# Patient Record
Sex: Female | Born: 1955
Health system: Southern US, Community
[De-identification: ages and names within clinical notes are randomized; demographics above are authoritative.]

## PROBLEM LIST (undated history)

## (undated) DIAGNOSIS — J189 Pneumonia, unspecified organism: Secondary | ICD-10-CM

## (undated) DIAGNOSIS — Z973 Presence of spectacles and contact lenses: Secondary | ICD-10-CM

## (undated) DIAGNOSIS — Z8489 Family history of other specified conditions: Secondary | ICD-10-CM

## (undated) DIAGNOSIS — K449 Diaphragmatic hernia without obstruction or gangrene: Secondary | ICD-10-CM

## (undated) DIAGNOSIS — M810 Age-related osteoporosis without current pathological fracture: Secondary | ICD-10-CM

## (undated) DIAGNOSIS — F32A Depression, unspecified: Secondary | ICD-10-CM

## (undated) DIAGNOSIS — F419 Anxiety disorder, unspecified: Secondary | ICD-10-CM

## (undated) DIAGNOSIS — F329 Major depressive disorder, single episode, unspecified: Secondary | ICD-10-CM

## (undated) DIAGNOSIS — Z8719 Personal history of other diseases of the digestive system: Secondary | ICD-10-CM

## (undated) DIAGNOSIS — I959 Hypotension, unspecified: Secondary | ICD-10-CM

## (undated) DIAGNOSIS — R011 Cardiac murmur, unspecified: Secondary | ICD-10-CM

## (undated) DIAGNOSIS — K219 Gastro-esophageal reflux disease without esophagitis: Secondary | ICD-10-CM

## (undated) DIAGNOSIS — Z8701 Personal history of pneumonia (recurrent): Secondary | ICD-10-CM

## (undated) DIAGNOSIS — M1990 Unspecified osteoarthritis, unspecified site: Secondary | ICD-10-CM

## (undated) DIAGNOSIS — G20A1 Parkinson's disease without dyskinesia, without mention of fluctuations: Secondary | ICD-10-CM

## (undated) DIAGNOSIS — T7840XA Allergy, unspecified, initial encounter: Secondary | ICD-10-CM

## (undated) DIAGNOSIS — K279 Peptic ulcer, site unspecified, unspecified as acute or chronic, without hemorrhage or perforation: Secondary | ICD-10-CM

## (undated) DIAGNOSIS — M19049 Primary osteoarthritis, unspecified hand: Secondary | ICD-10-CM

## (undated) DIAGNOSIS — E039 Hypothyroidism, unspecified: Secondary | ICD-10-CM

## (undated) DIAGNOSIS — G250 Essential tremor: Secondary | ICD-10-CM

## (undated) DIAGNOSIS — G709 Myoneural disorder, unspecified: Secondary | ICD-10-CM

## (undated) HISTORY — PX: TONGUE SURGERY: SHX810

## (undated) HISTORY — PX: WISDOM TOOTH EXTRACTION: SHX21

## (undated) HISTORY — DX: Myoneural disorder, unspecified: G70.9

## (undated) HISTORY — DX: Peptic ulcer, site unspecified, unspecified as acute or chronic, without hemorrhage or perforation: K27.9

## (undated) HISTORY — PX: TUBAL LIGATION: SHX77

## (undated) HISTORY — DX: Cardiac murmur, unspecified: R01.1

## (undated) HISTORY — DX: Depression, unspecified: F32.A

## (undated) HISTORY — PX: DILATION AND CURETTAGE OF UTERUS: SHX78

## (undated) HISTORY — DX: Personal history of pneumonia (recurrent): Z87.01

## (undated) HISTORY — DX: Allergy, unspecified, initial encounter: T78.40XA

## (undated) HISTORY — DX: Anxiety disorder, unspecified: F41.9

---
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
## 1988-07-17 HISTORY — PX: HERNIA REPAIR: SHX51

## 1998-01-06 ENCOUNTER — Other Ambulatory Visit: Admission: RE | Admit: 1998-01-06 | Discharge: 1998-01-06 | Payer: Self-pay | Admitting: Obstetrics and Gynecology

## 1999-01-03 ENCOUNTER — Other Ambulatory Visit: Admission: RE | Admit: 1999-01-03 | Discharge: 1999-01-03 | Payer: Self-pay | Admitting: Obstetrics and Gynecology

## 2000-01-02 ENCOUNTER — Other Ambulatory Visit: Admission: RE | Admit: 2000-01-02 | Discharge: 2000-01-02 | Payer: Self-pay | Admitting: Obstetrics and Gynecology

## 2001-01-14 ENCOUNTER — Other Ambulatory Visit: Admission: RE | Admit: 2001-01-14 | Discharge: 2001-01-14 | Payer: Self-pay | Admitting: Obstetrics and Gynecology

## 2010-01-20 ENCOUNTER — Ambulatory Visit: Payer: Self-pay | Admitting: Oncology

## 2010-02-02 LAB — MORPHOLOGY

## 2010-02-02 LAB — CBC & DIFF AND RETIC
Eosinophils Absolute: 0.2 10*3/uL (ref 0.0–0.5)
HCT: 42 % (ref 34.8–46.6)
Immature Retic Fract: 2.8 % (ref 0.00–10.70)
LYMPH%: 42.8 % (ref 14.0–49.7)
MCHC: 33.6 g/dL (ref 31.5–36.0)
MCV: 101.9 fL — ABNORMAL HIGH (ref 79.5–101.0)
MONO#: 0.9 10*3/uL (ref 0.1–0.9)
MONO%: 5.2 % (ref 0.0–14.0)
NEUT#: 8.6 10*3/uL — ABNORMAL HIGH (ref 1.5–6.5)
NEUT%: 50.5 % (ref 38.4–76.8)
Platelets: 232 10*3/uL (ref 145–400)
WBC: 17.1 10*3/uL — ABNORMAL HIGH (ref 3.9–10.3)

## 2010-02-07 LAB — FOLATE: Folate: 18.8 ng/mL

## 2010-02-07 LAB — VITAMIN B12: Vitamin B-12: 522 pg/mL (ref 211–911)

## 2010-02-07 LAB — ANA: Anti Nuclear Antibody(ANA): POSITIVE — AB

## 2010-02-07 LAB — LACTATE DEHYDROGENASE: LDH: 106 U/L (ref 94–250)

## 2010-09-22 ENCOUNTER — Other Ambulatory Visit: Payer: Self-pay | Admitting: Geriatric Medicine

## 2010-09-22 ENCOUNTER — Ambulatory Visit
Admission: RE | Admit: 2010-09-22 | Discharge: 2010-09-22 | Disposition: A | Discharge status: Home / Self Care | Payer: BC Managed Care – PPO | Source: Ambulatory Visit | Attending: Geriatric Medicine | Admitting: Geriatric Medicine

## 2010-09-22 DIAGNOSIS — R52 Pain, unspecified: Secondary | ICD-10-CM

## 2012-01-16 ENCOUNTER — Other Ambulatory Visit: Payer: Self-pay | Admitting: *Deleted

## 2012-01-16 ENCOUNTER — Other Ambulatory Visit: Payer: Self-pay | Admitting: Geriatric Medicine

## 2012-01-16 DIAGNOSIS — Z1231 Encounter for screening mammogram for malignant neoplasm of breast: Secondary | ICD-10-CM

## 2012-02-07 ENCOUNTER — Ambulatory Visit
Admission: RE | Admit: 2012-02-07 | Discharge: 2012-02-07 | Disposition: A | Discharge status: Home / Self Care | Payer: BC Managed Care – PPO | Source: Ambulatory Visit | Attending: Geriatric Medicine | Admitting: Geriatric Medicine

## 2012-02-07 DIAGNOSIS — Z1231 Encounter for screening mammogram for malignant neoplasm of breast: Secondary | ICD-10-CM

## 2012-12-13 ENCOUNTER — Emergency Department (HOSPITAL_COMMUNITY)
Admission: EM | Admit: 2012-12-13 | Discharge: 2012-12-13 | Disposition: A | Discharge status: Home / Self Care | Payer: BC Managed Care – PPO | Attending: Emergency Medicine | Admitting: Emergency Medicine

## 2012-12-13 ENCOUNTER — Emergency Department (HOSPITAL_COMMUNITY): Payer: BC Managed Care – PPO

## 2012-12-13 ENCOUNTER — Encounter (HOSPITAL_COMMUNITY): Payer: Self-pay | Admitting: Emergency Medicine

## 2012-12-13 DIAGNOSIS — S0990XA Unspecified injury of head, initial encounter: Secondary | ICD-10-CM | POA: Insufficient documentation

## 2012-12-13 DIAGNOSIS — Z8679 Personal history of other diseases of the circulatory system: Secondary | ICD-10-CM | POA: Insufficient documentation

## 2012-12-13 DIAGNOSIS — R209 Unspecified disturbances of skin sensation: Secondary | ICD-10-CM | POA: Insufficient documentation

## 2012-12-13 DIAGNOSIS — Z880 Allergy status to penicillin: Secondary | ICD-10-CM | POA: Insufficient documentation

## 2012-12-13 DIAGNOSIS — Y92009 Unspecified place in unspecified non-institutional (private) residence as the place of occurrence of the external cause: Secondary | ICD-10-CM | POA: Insufficient documentation

## 2012-12-13 DIAGNOSIS — W1809XA Striking against other object with subsequent fall, initial encounter: Secondary | ICD-10-CM | POA: Insufficient documentation

## 2012-12-13 DIAGNOSIS — Y9389 Activity, other specified: Secondary | ICD-10-CM | POA: Insufficient documentation

## 2012-12-13 DIAGNOSIS — S0993XA Unspecified injury of face, initial encounter: Secondary | ICD-10-CM | POA: Insufficient documentation

## 2012-12-13 DIAGNOSIS — M542 Cervicalgia: Secondary | ICD-10-CM

## 2012-12-13 HISTORY — DX: Hypotension, unspecified: I95.9

## 2012-12-13 MED ORDER — NAPROXEN 500 MG PO TABS: 500.0000 mg | ORAL_TABLET | Freq: Two times a day (BID) | ORAL | Status: DC

## 2012-12-13 NOTE — Progress Notes (Signed)
[facility] CM noted pt with coverage but no pcp listed Spoke with pt who confirms no pcp  [facility] CM spoke with pt on how to obtain an in network pcp with insurance coverage via the customer service number or web site  Provided her with a 9 page list of in network pcps within 5 mile radius of zip [phone] Pt states previously seen by [facility] 3 years ago and she was informed when she called them this am that because she had not been seen in 3 years she was no longer an active pt

## 2012-12-13 NOTE — ED Notes (Signed)
Pt states that she tripped while outsdie and hit head on the wall of the house on Monday and stats that since she has had a headache, dizzyness, and feels like "pressure" to head, stiffness to neck, and bil temple area. Had a steriod shot on Sunday. Alert x4, no facial droop, no slurred speech

## 2012-12-13 NOTE — ED Provider Notes (Signed)
History     CSN: [id number]  Arrival date & time 12/13/12  1114   First MD Initiated Contact with Patient 12/13/12 1159      Chief Complaint  Patient presents with  . Headache  . Fall    (Consider location/radiation/quality/duration/timing/severity/associated sxs/prior treatment) HPI Comments: 57 year old female who presents with complaint of a head injury which occurred on Monday. She states that she was leaning forward trying to do some yard work when she slipped forward striking her head against a wall. She since that time has had a mild headache, a feeling of haziness or fogginess which is associated with left arm and leg tingling and a feeling of nausea with no appetite. She denies facial droop, slurred speech or ataxia and overall her symptoms are persistent and not seeming to improve. Nothing makes this better or worse, symptoms are mild to moderate  Patient is a 57 y.o. female presenting with headaches and fall. The history is provided by the patient.  Headache Fall Associated symptoms include headaches.    Past Medical History  Diagnosis Date  . Hypotension     History reviewed. No pertinent past surgical history.  No family history on file.  History  Substance Use Topics  . Smoking status: Not on file  . Smokeless tobacco: Not on file  . Alcohol Use: Not on file    OB History   Grav Para Term Preterm Abortions TAB SAB Ect Mult Living                  Review of Systems  Neurological: Positive for headaches.  All other systems reviewed and are negative.    Allergies  Penicillins  Home Medications   Current Outpatient Rx  Name  Route  Sig  Dispense  Refill  . ibuprofen (ADVIL,MOTRIN) 200 MG tablet   Oral   Take 400 mg by mouth every 6 (six) hours as needed for pain (headache).         [facility] PRESCRIPTION MEDICATION      Biest Cream - estrogen/testosterone         . progesterone (PROMETRIUM) 100 MG capsule   Oral   Take 100 mg by mouth  daily.         . naproxen (NAPROSYN) 500 MG tablet   Oral   Take 1 tablet (500 mg total) by mouth 2 (two) times daily with a meal.   30 tablet   0     BP 121/66  Pulse 73  Temp(Src) 97.5 F (36.4 C) (Oral)  Resp 18  SpO2 99%  Physical Exam  Nursing note and vitals reviewed. Constitutional: She appears well-developed and well-nourished. No distress.  HENT:  Head: Normocephalic and atraumatic.  Mouth/Throat: Oropharynx is clear and moist. No oropharyngeal exudate.  No signs of trauma to the head, normocephalic, atraumatic, oropharynx is clear with moist mucous membranes and a clear pharynx without asymmetry, exudate, hypertrophy or erythema. Nasal passages are clear with no rhinorrhea or turbinate swelling. Tympanic membranes are clear bilaterally  no hemotympanum, no malocclusion, no raccoon eyes or Battle sign  Eyes: Conjunctivae and EOM are normal. Pupils are equal, round, and reactive to light. Right eye exhibits no discharge. Left eye exhibits no discharge. No scleral icterus.  Neck: Normal range of motion. Neck supple. No JVD present. No thyromegaly present.  Cardiovascular: Normal rate, regular rhythm, normal heart sounds and intact distal pulses.  Exam reveals no gallop and no friction rub.   No murmur heard. Pulmonary/Chest: Effort normal  and breath sounds normal. No respiratory distress. She has no wheezes. She has no rales.  Abdominal: Soft. Bowel sounds are normal. She exhibits no distension and no mass. There is no tenderness.  Musculoskeletal: Normal range of motion. She exhibits no edema and no tenderness.  Lymphadenopathy:    She has no cervical adenopathy.  Neurological: She is alert. Coordination normal.  The patient has normal speech, normal memory, normal coordination of all 4 extremities without any limb ataxia. The patient has normal strength of all 4 extremities at the major muscle groups including shoulders, biceps, triceps, forearm, grips, quadriceps,  hamstrings, calves. Normal sensation to light touch and pinprick of all 4 extremities and the trunk. No truncal ataxia, normal gait, cranial nerves III through XII are intact.  Normal peripheral visual fields. Normal extraocular movements. Normal pupillary exam. No pronator drift, normal reflexes at the brachial radialis, biceps and patellar tendons. Normal position sense, normal temperature sensation to cold and warm  Skin: Skin is warm and dry. No rash noted. No erythema.  Psychiatric: She has a normal mood and affect. Her behavior is normal.    ED Course  Procedures (including critical care time)  Labs Reviewed - No data to display Ct Head Wo Contrast  12/13/2012   *RADIOLOGY REPORT*  Clinical Data:  Severe headache, dizziness, stiffness, trauma  CT HEAD WITHOUT CONTRAST  Technique:  Contiguous axial images were obtained from the base of the skull through the vertex without contrast  Comparison:  None.  Findings:  The brain has a normal appearance without evidence for hemorrhage, acute infarction, hydrocephalus, or mass lesion.  There is no extra axial fluid collection.  The skull and paranasal sinuses are normal.  IMPRESSION: Normal CT of the head without contrast.  CT CERVICAL SPINE WITHOUT CONTRAST  Technique:  Multidetector CT imaging of the cervical spine was performed without intravenous contrast.  Multiplanar CT image reconstructions were also generated.  Comparison:   None.  Findings:  Normal alignment.  Negative for fracture, compression deformity, or focal kyphosis.  Facets aligned.  Mild spondylosis and degenerative change at C5-6 and C6-7.  Preserved vertebral body heights.  Intact odontoid.  Normal prevertebral soft tissues.  No soft tissue asymmetry in the neck.  Lung apices clear.  IMPRESSION: Degenerative changes.  No acute fracture.   Original Report Authenticated By: Judie Petit. Miles Costain, M.D.   Ct Cervical Spine Wo Contrast  12/13/2012   *RADIOLOGY REPORT*  Clinical Data:  Severe headache,  dizziness, stiffness, trauma  CT HEAD WITHOUT CONTRAST  Technique:  Contiguous axial images were obtained from the base of the skull through the vertex without contrast  Comparison:  None.  Findings:  The brain has a normal appearance without evidence for hemorrhage, acute infarction, hydrocephalus, or mass lesion.  There is no extra axial fluid collection.  The skull and paranasal sinuses are normal.  IMPRESSION: Normal CT of the head without contrast.  CT CERVICAL SPINE WITHOUT CONTRAST  Technique:  Multidetector CT imaging of the cervical spine was performed without intravenous contrast.  Multiplanar CT image reconstructions were also generated.  Comparison:   None.  Findings:  Normal alignment.  Negative for fracture, compression deformity, or focal kyphosis.  Facets aligned.  Mild spondylosis and degenerative change at C5-6 and C6-7.  Preserved vertebral body heights.  Intact odontoid.  Normal prevertebral soft tissues.  No soft tissue asymmetry in the neck.  Lung apices clear.  IMPRESSION: Degenerative changes.  No acute fracture.   Original Report Authenticated By: Judie Petit. Shick,  M.D.     1. Minor head injury, initial encounter   2. Neck pain       MDM  Overall the patient appears very well, she has no objective findings of numbness or weakness in her left arm but has subjective feelings and with her neck injury and head injury will need to have imaging to rule out fracture and she had fractured her cervical spine at a young age. Vital signs are normal, neurologic intact, denies infectious symptoms, declines pain medications or nausea medications at time this.  CT scan imaging negative, stable for discharge  Meds given in ED:  Medications - No data to display  New Prescriptions   NAPROXEN (NAPROSYN) 500 MG TABLET    Take 1 tablet (500 mg total) by mouth 2 (two) times daily with a meal.          Vida Roller, MD 12/13/12 1300

## 2012-12-30 ENCOUNTER — Ambulatory Visit: Payer: BC Managed Care – PPO

## 2012-12-30 ENCOUNTER — Ambulatory Visit: Payer: BC Managed Care – PPO | Admitting: Emergency Medicine

## 2012-12-30 VITALS — BP 98/66 | HR 80 | Temp 98.4°F | Resp 18 | Wt 170.0 lb

## 2012-12-30 DIAGNOSIS — R059 Cough, unspecified: Secondary | ICD-10-CM

## 2012-12-30 DIAGNOSIS — J309 Allergic rhinitis, unspecified: Secondary | ICD-10-CM

## 2012-12-30 DIAGNOSIS — K219 Gastro-esophageal reflux disease without esophagitis: Secondary | ICD-10-CM

## 2012-12-30 DIAGNOSIS — T782XXD Anaphylactic shock, unspecified, subsequent encounter: Secondary | ICD-10-CM

## 2012-12-30 DIAGNOSIS — R05 Cough: Secondary | ICD-10-CM

## 2012-12-30 DIAGNOSIS — J45909 Unspecified asthma, uncomplicated: Secondary | ICD-10-CM

## 2012-12-30 MED ORDER — MONTELUKAST SODIUM 10 MG PO TABS: 10.0000 mg | ORAL_TABLET | Freq: Every day | ORAL | Status: DC

## 2012-12-30 MED ORDER — ALBUTEROL SULFATE (2.5 MG/3ML) 0.083% IN NEBU
2.5000 mg | INHALATION_SOLUTION | Freq: Once | RESPIRATORY_TRACT | Status: AC
  Administered 2012-12-30: 2.5 mg via RESPIRATORY_TRACT

## 2012-12-30 MED ORDER — PREDNISONE 10 MG PO TABS: ORAL_TABLET | ORAL | Status: DC

## 2012-12-30 MED ORDER — ALBUTEROL SULFATE HFA 108 (90 BASE) MCG/ACT IN AERS: 2.0000 | INHALATION_SPRAY | RESPIRATORY_TRACT | Status: DC | PRN

## 2012-12-30 MED ORDER — FAMOTIDINE 20 MG PO TABS: 20.0000 mg | ORAL_TABLET | Freq: Every evening | ORAL | Status: DC | PRN

## 2012-12-30 MED ORDER — EPINEPHRINE 0.3 MG/0.3ML IJ SOAJ: 0.3000 mg | Freq: Once | INTRAMUSCULAR | Status: DC

## 2012-12-30 NOTE — Progress Notes (Signed)
  Subjective:    Patient ID: Diane Bryant, female    DOB: 1955/09/11, 57 y.o.   MRN: [id number]  HPI patient states she has had a long-time problem with allergies. She has trouble whenever she is sensitive to antihistamines and decongestants. She is having a lot of difficulty with cough. She goes for walks every day in the late [place] area.    Review of Systems the patient's father with chronic reflux. She's not on medication for this. She also states that she frequently goes for walks and has had a bad reaction to an insect staying while staying in the past. She. Her daughters expired EpiPen with her     Objective:   Physical Exam patient is alert and cooperative with frequent cough. TMs are clear. Nose is congested. Posterior pharynx is clear. Neck is supple chest exam revealed decreased breath sounds in the bases with wheezing on end expiration bilaterally. Peak flow was checked and was 330.  [facility] reading (PRIMARY) by  Dr. Cleta Alberts there are increased markings in both bases        Assessment & Plan:  Prednisone taper. For maintenance we'll try Singulair 10 mg at night along with Pepcid 20 mg at night. She is given a rescue inhaler of albuterol . She was also given a prescription for an EpiPen since she is out of the woods on her own.

## 2012-12-30 NOTE — Patient Instructions (Addendum)
asthmaAsthma Attack Prevention HOW CAN ASTHMA BE PREVENTED? Currently, there is no way to prevent asthma from starting. However, you can take steps to control the disease and prevent its symptoms after you have been diagnosed. Learn about your asthma and how to control it. Take an active role to control your asthma by working with your caregiver to create and follow an asthma action plan. An asthma action plan guides you in taking your medicines properly, avoiding factors that make your asthma worse, tracking your level of asthma control, responding to worsening asthma, and seeking emergency care when needed. To track your asthma, keep records of your symptoms, check your peak flow number using a peak flow meter (handheld device that shows how well air moves out of your lungs), and get regular asthma checkups.  Other ways to prevent asthma attacks include:  Use medicines as your caregiver directs.  Identify and avoid things that make your asthma worse (as much as you can).  Keep track of your asthma symptoms and level of control.  Get regular checkups for your asthma.  With your caregiver, write a detailed plan for taking medicines and managing an asthma attack. Then be sure to follow your action plan. Asthma is an ongoing condition that needs regular monitoring and treatment.  Identify and avoid asthma triggers. A number of outdoor allergens and irritants (pollen, mold, cold air, air pollution) can trigger asthma attacks. Find out what causes or makes your asthma worse, and take steps to avoid those triggers.  Monitor your breathing. Learn to recognize warning signs of an attack, such as slight coughing, wheezing or shortness of breath. However, your lung function may already decrease before you notice any signs or symptoms, so regularly measure and record your peak airflow with a home peak flow meter.  Identify and treat attacks early. If you act quickly, you're less likely to have a severe  attack. You will also need less medicine to control your symptoms. When your peak flow measurements decrease and alert you to an upcoming attack, take your medicine as instructed, and immediately stop any activity that may have triggered the attack. If your symptoms do not improve, get medical help.  Pay attention to increasing quick-relief inhaler use. If you find yourself relying on your quick-relief inhaler (such as albuterol), your asthma is not under control. See your caregiver about adjusting your treatment. IDENTIFY AND CONTROL FACTORS THAT MAKE YOUR ASTHMA WORSE A number of common things can set off or make your asthma symptoms worse (asthma triggers). Keep track of your asthma symptoms for several weeks, detailing all the environmental and emotional factors that are linked with your asthma. When you have an asthma attack, go back to your asthma diary to see which factor, or combination of factors, might have contributed to it. Once you know what these factors are, you can take steps to control many of them.  Allergies: If you have allergies and asthma, it is important to take asthma prevention steps at home. Asthma attacks (worsening of asthma symptoms) can be triggered by allergies, which can cause temporary increased inflammation of your airways. Minimizing contact with the substance to which you are allergic will help prevent an asthma attack. Animal Dander:   Some people are allergic to the flakes of skin or dried saliva from animals with fur or feathers. Keep these pets out of your home.  If you can't keep a pet outdoors, keep the pet out of your bedroom and other sleeping areas at all times,  and keep the door closed.  Remove carpets and furniture covered with cloth from your home. If that is not possible, keep the pet away from fabric-covered furniture and carpets. Dust Mites:  Many people with asthma are allergic to dust mites. Dust mites are tiny bugs that are found in every home, in  mattresses, pillows, carpets, fabric-covered furniture, bedcovers, clothes, stuffed toys, fabric, and other fabric-covered items.  Cover your mattress in a special dust-proof cover.  Cover your pillow in a special dust-proof cover, or wash the pillow each week in hot water. Water must be hotter than 130 F to kill dust mites. Cold or warm water used with detergent and bleach can also be effective.  Wash the sheets and blankets on your bed each week in hot water.  Try not to sleep or lie on cloth-covered cushions.  Call ahead when traveling and ask for a smoke-free hotel room. Bring your own bedding and pillows, in case the hotel only supplies feather pillows and down comforters, which may contain dust mites and cause asthma symptoms.  Remove carpets from your bedroom and those laid on concrete, if you can.  Keep stuffed toys out of the bed, or wash the toys weekly in hot water or cooler water with detergent and bleach. Cockroaches:  Many people with asthma are allergic to the droppings and remains of cockroaches.  Keep food and garbage in closed containers. Never leave food out.  Use poison baits, traps, powders, gels, or paste (for example, boric acid).  If a spray is used to kill cockroaches, stay out of the room until the odor goes away. Indoor Mold:  Fix leaky faucets, pipes, or other sources of water that have mold around them.  Clean floors and moldy surfaces with a fungicide or diluted bleach.  Avoid using humidifiers, vaporizers, or swamp coolers. These can spread molds through the air. Pollen and Outdoor Mold:  When pollen or mold spore counts are high, try to keep your windows closed.  Stay indoors with windows closed from late morning to afternoon, if you can. Pollen and some mold spore counts are highest at that time.  Ask your caregiver whether you need to take or increase anti-inflammatory medicine before your allergy season starts. Irritants:   Tobacco smoke is  an irritant. If you smoke, ask your caregiver how you can quit. Ask family members to quit smoking, too. Do not allow smoking in your home or car.  If possible, do not use a wood-burning stove, kerosene heater, or fireplace. Minimize exposure to all sources of smoke, including incense, candles, fires, and fireworks.  Try to stay away from strong odors and sprays, such as perfume, talcum powder, hair spray, and paints.  Decrease humidity in your home and use an indoor air cleaning device. Reduce indoor humidity to below 60 percent. Dehumidifiers or central air conditioners can do this.  Decrease house dust exposure by changing furnace and air cooler filters frequently.  Try to have someone else vacuum for you once or twice a week, if you can. Stay out of rooms while they are being vacuumed and for a short while afterward.  If you vacuum, use a dust mask from a hardware store, a double-layered or microfilter vacuum cleaner bag, or a vacuum cleaner with a HEPA filter.  Sulfites in foods and beverages can be irritants. Do not drink beer or wine, or eat dried fruit, processed potatoes, or shrimp if they cause asthma symptoms.  Cold air can trigger an asthma attack.  Cover your nose and mouth with a scarf on cold or windy days.  Several health conditions can make asthma more difficult to manage, including runny nose, sinus infections, reflux disease, psychological stress, and sleep apnea. Your caregiver will treat these conditions, as well.  Avoid close contact with people who have a cold or the flu, since your asthma symptoms may get worse if you catch the infection from them. Wash your hands thoroughly after touching items that may have been handled by people with a respiratory infection.  Get a flu shot every year to protect against the flu virus, which often makes asthma worse for days or weeks. Also get a pneumonia shot once every 5 10 years. Medicines:  Aspirin and other pain relievers can  cause asthma attacks. Ten percent to 20% of people with asthma have sensitivity to aspirin or a group of pain relievers called non-steroidal anti-inflammatory medicines (NSAIDS), such as ibuprofen and naproxen. These medicines are used to treat pain and reduce fevers. Asthma attacks caused by any of these medicines can be severe and even fatal. These medicines must be avoided in people who have known aspirin sensitive asthma. Products with acetaminophen are considered safe for people who have asthma. It is important that people with aspirin sensitivity read labels of all over-the-counter medicines used to treat pain, colds, coughs, and fever.  Beta blockers and ACE inhibitors are other medicines which you should discuss with your caregiver, in relation to your asthma. ALLERGY SKIN TESTING  Ask your asthma caregiver about allergy skin testing or blood testing (RAST test) to identify the allergens to which you are sensitive. If you are found to have allergies, allergy shots (immunotherapy) for asthma may help prevent future allergies and asthma. With allergy shots, small doses of allergens (substances to which you are allergic) are injected under your skin on a regular schedule. Over a period of time, your body may become used to the allergen and less responsive with asthma symptoms. You can also take measures to minimize your exposure to those allergens. EXERCISE  If you have exercise-induced asthma, or are planning vigorous exercise, or exercise in cold, humid, or dry environments, prevent exercise-induced asthma by following your caregiver's advice regarding asthma treatment before exercising. Document Released: 06/21/2009 Document Revised: 06/19/2012 Document Reviewed: 06/21/2009 [facility] Patient Information 2014 [place], [place]. Allergic Rhinitis Allergic rhinitis is when the mucous membranes in the nose respond to allergens. Allergens are particles in the air that cause your body to have an allergic  reaction. This causes you to release allergic antibodies. Through a chain of events, these eventually cause you to release histamine into the blood stream (hence the use of antihistamines). Although meant to be protective to the body, it is this release that causes your discomfort, such as frequent sneezing, congestion and an itchy runny nose.  CAUSES  The pollen allergens may come from grasses, trees, and weeds. This is seasonal allergic rhinitis, or "hay fever." Other allergens cause year-round allergic rhinitis (perennial allergic rhinitis) such as house dust mite allergen, pet dander and mold spores.  SYMPTOMS   Nasal stuffiness (congestion).  Runny, itchy nose with sneezing and tearing of the eyes.  There is often an itching of the mouth, eyes and ears. It cannot be cured, but it can be controlled with medications. DIAGNOSIS  If you are unable to determine the offending allergen, skin or blood testing may find it. TREATMENT   Avoid the allergen.  Medications and allergy shots (immunotherapy) can help.  Hay fever  may often be treated with antihistamines in pill or nasal spray forms. Antihistamines block the effects of histamine. There are over-the-counter medicines that may help with nasal congestion and swelling around the eyes. Check with your caregiver before taking or giving this medicine. If the treatment above does not work, there are many new medications your caregiver can prescribe. Stronger medications may be used if initial measures are ineffective. Desensitizing injections can be used if medications and avoidance fails. Desensitization is when a patient is given ongoing shots until the body becomes less sensitive to the allergen. Make sure you follow up with your caregiver if problems continue. SEEK MEDICAL CARE IF:   You develop fever (more than 100.5 F (38.1 C).  You develop a cough that does not stop easily (persistent).  You have shortness of breath.  You start  wheezing.  Symptoms interfere with normal daily activities. Document Released: 03/28/2001 Document Revised: 09/25/2011 Document Reviewed: 10/07/2008 [facility] Patient Information 2014 [place], [place].

## 2013-04-28 ENCOUNTER — Encounter (HOSPITAL_BASED_OUTPATIENT_CLINIC_OR_DEPARTMENT_OTHER): Payer: Self-pay | Admitting: *Deleted

## 2013-04-28 NOTE — Progress Notes (Signed)
No labs needed

## 2013-04-29 ENCOUNTER — Other Ambulatory Visit: Payer: Self-pay | Admitting: Orthopedic Surgery

## 2013-04-30 NOTE — H&P (Signed)
  Diane Bryant is an 57 y.o. female.   Chief Complaint: c/o cystic mass right thumb HPI: .  She presents for evaluation of a cystic mass on the dorsoradial aspect of her right thumb MP joint that has been present for five years and gradually increasing in size.  It is not tender. It is slightly mobile.  It transilluminates.  She would like to have this removed.     Past Medical History  Diagnosis Date  . Hypotension   . Allergy   . Anxiety   . Neuromuscular disorder   . Heart murmur     when pregnant-echo in quate-nothing showed up  . Low BP   . Benign essential tremor   . Wears glasses     Past Surgical History  Procedure Laterality Date  . Hernia repair  1990    bilat ing  . Tubal ligation      History reviewed. No pertinent family history. Social History:  reports that she quit smoking about 40 years ago. She does not have any smokeless tobacco history on file. She reports that she uses illicit drugs (Marijuana). She reports that she does not drink alcohol.  Allergies:  Allergies  Allergen Reactions  . Penicillins Swelling    No prescriptions prior to admission    No results found for this or any previous visit (from the past 48 hour(s)).  No results found.   Pertinent items are noted in HPI.  Height 5' 5.5" (1.664 m), weight 77.111 kg (170 lb).  General appearance: alert Head: Normocephalic, without obvious abnormality Neck: supple, symmetrical, trachea midline Resp: clear to auscultation bilaterally Cardio: regular rate and rhythm GI: normal findings: bowel sounds normal Extremities::  Examination reveals a 15 x 10 mm.  soft cyst compatible with a myxoid cyst over the extensor mechanism and/or MP joint capsule/ligaments.  This is mobile and minimally tender.  It does transilluminate.  She has full range of motion of her forearm, wrist and finger.  There is no sign of rotation of her radial superficial sensory branches.   RADIOGRAPHS:   X-rays of her  thumb demonstrate normal bony anatomy.  The soft tissue mass is visible.  There is no significant arthrosis at the metacarpophalangeal joint.  Pulses: 2+ and symmetric Skin: normal Neurologic: Grossly normal    Assessment/Plan Impression: Myxoid cyst right thumb  Plan: To the OR for excision of mass right thumb.The procedure, risks,benefits and post-op course were discussed with the patient at length and they were in agreement with the plan.  Diane Bryant,Diane Bryant 04/30/2013, 4:03 PM  H&P documentation: 05/01/2013  -History and Physical Reviewed  -Patient has been re-examined  -No change in the plan of care  Diane Forster, MD

## 2013-05-01 ENCOUNTER — Ambulatory Visit (HOSPITAL_BASED_OUTPATIENT_CLINIC_OR_DEPARTMENT_OTHER)
Admission: RE | Admit: 2013-05-01 | Discharge: 2013-05-01 | Disposition: A | Discharge status: Home / Self Care | Payer: BC Managed Care – PPO | Source: Ambulatory Visit | Attending: Orthopedic Surgery | Admitting: Orthopedic Surgery

## 2013-05-01 ENCOUNTER — Ambulatory Visit (HOSPITAL_BASED_OUTPATIENT_CLINIC_OR_DEPARTMENT_OTHER): Payer: BC Managed Care – PPO | Admitting: Anesthesiology

## 2013-05-01 ENCOUNTER — Encounter (HOSPITAL_BASED_OUTPATIENT_CLINIC_OR_DEPARTMENT_OTHER): Payer: BC Managed Care – PPO | Admitting: Anesthesiology

## 2013-05-01 ENCOUNTER — Encounter (HOSPITAL_BASED_OUTPATIENT_CLINIC_OR_DEPARTMENT_OTHER): Payer: Self-pay | Admitting: Anesthesiology

## 2013-05-01 ENCOUNTER — Encounter (HOSPITAL_BASED_OUTPATIENT_CLINIC_OR_DEPARTMENT_OTHER)
Admission: RE | Disposition: A | Discharge status: Home / Self Care | Payer: Self-pay | Source: Ambulatory Visit | Attending: Orthopedic Surgery

## 2013-05-01 DIAGNOSIS — I951 Orthostatic hypotension: Secondary | ICD-10-CM | POA: Insufficient documentation

## 2013-05-01 DIAGNOSIS — F411 Generalized anxiety disorder: Secondary | ICD-10-CM | POA: Insufficient documentation

## 2013-05-01 DIAGNOSIS — D2110 Benign neoplasm of connective and other soft tissue of unspecified upper limb, including shoulder: Secondary | ICD-10-CM | POA: Insufficient documentation

## 2013-05-01 DIAGNOSIS — G250 Essential tremor: Secondary | ICD-10-CM | POA: Insufficient documentation

## 2013-05-01 HISTORY — PX: EXCISION METACARPAL MASS: SHX6372

## 2013-05-01 HISTORY — DX: Presence of spectacles and contact lenses: Z97.3

## 2013-05-01 HISTORY — DX: Hypotension, unspecified: I95.9

## 2013-05-01 HISTORY — DX: Essential tremor: G25.0

## 2013-05-01 LAB — POCT HEMOGLOBIN-HEMACUE: Hemoglobin: 15 g/dL (ref 12.0–15.0)

## 2013-05-01 SURGERY — EXCISION METACARPAL MASS
Anesthesia: Monitor Anesthesia Care | Site: Thumb | Laterality: Right | Wound class: Clean

## 2013-05-01 MED ORDER — HYDROMORPHONE HCL PF 1 MG/ML IJ SOLN: 0.2500 mg | INTRAMUSCULAR | Status: DC | PRN

## 2013-05-01 MED ORDER — FENTANYL CITRATE 0.05 MG/ML IJ SOLN
INTRAMUSCULAR | Status: DC | PRN
  Administered 2013-05-01 (×2): 50 ug via INTRAVENOUS

## 2013-05-01 MED ORDER — METHYLPREDNISOLONE ACETATE 40 MG/ML IJ SUSP
INTRAMUSCULAR | Status: AC
  Filled 2013-05-01: qty 1

## 2013-05-01 MED ORDER — DEXAMETHASONE SODIUM PHOSPHATE 4 MG/ML IJ SOLN
INTRAMUSCULAR | Status: DC | PRN
  Administered 2013-05-01: 10 mg via INTRAVENOUS

## 2013-05-01 MED ORDER — LIDOCAINE HCL (CARDIAC) 20 MG/ML IV SOLN
INTRAVENOUS | Status: DC | PRN
  Administered 2013-05-01: 50 mg via INTRAVENOUS

## 2013-05-01 MED ORDER — METOCLOPRAMIDE HCL 5 MG/ML IJ SOLN: 10.0000 mg | Freq: Once | INTRAMUSCULAR | Status: DC | PRN

## 2013-05-01 MED ORDER — ACETAMINOPHEN-CODEINE 300-30 MG PO TABS: 1.0000 | ORAL_TABLET | ORAL | Status: DC | PRN

## 2013-05-01 MED ORDER — CHLORHEXIDINE GLUCONATE 4 % EX LIQD: 60.0000 mL | Freq: Once | CUTANEOUS | Status: DC

## 2013-05-01 MED ORDER — OXYCODONE HCL 5 MG PO TABS
ORAL_TABLET | ORAL | Status: AC
  Filled 2013-05-01: qty 1

## 2013-05-01 MED ORDER — OXYCODONE HCL 5 MG/5ML PO SOLN: 5.0000 mg | Freq: Once | ORAL | Status: AC | PRN

## 2013-05-01 MED ORDER — LIDOCAINE HCL 2 % IJ SOLN
INTRAMUSCULAR | Status: DC | PRN
  Administered 2013-05-01: 1 mL

## 2013-05-01 MED ORDER — MIDAZOLAM HCL 2 MG/2ML IJ SOLN
INTRAMUSCULAR | Status: AC
  Filled 2013-05-01: qty 2

## 2013-05-01 MED ORDER — LACTATED RINGERS IV SOLN
INTRAVENOUS | Status: DC
  Administered 2013-05-01: 07:00:00 via INTRAVENOUS

## 2013-05-01 MED ORDER — PROPOFOL 10 MG/ML IV BOLUS
INTRAVENOUS | Status: DC | PRN
  Administered 2013-05-01: 50 mg via INTRAVENOUS
  Administered 2013-05-01: 150 mg via INTRAVENOUS

## 2013-05-01 MED ORDER — OXYCODONE HCL 5 MG PO TABS
5.0000 mg | ORAL_TABLET | Freq: Once | ORAL | Status: AC | PRN
  Administered 2013-05-01: 5 mg via ORAL

## 2013-05-01 MED ORDER — MIDAZOLAM HCL 5 MG/5ML IJ SOLN
INTRAMUSCULAR | Status: DC | PRN
  Administered 2013-05-01: 2 mg via INTRAVENOUS

## 2013-05-01 MED ORDER — FENTANYL CITRATE 0.05 MG/ML IJ SOLN
INTRAMUSCULAR | Status: AC
  Filled 2013-05-01: qty 2

## 2013-05-01 MED ORDER — LIDOCAINE HCL 2 % IJ SOLN
INTRAMUSCULAR | Status: AC
  Filled 2013-05-01: qty 20

## 2013-05-01 SURGICAL SUPPLY — 51 items
BANDAGE ADHESIVE 1X3 (GAUZE/BANDAGES/DRESSINGS) IMPLANT
BANDAGE COBAN STERILE 2 (GAUZE/BANDAGES/DRESSINGS) IMPLANT
BANDAGE ELASTIC 3 VELCRO ST LF (GAUZE/BANDAGES/DRESSINGS) IMPLANT
BANDAGE GAUZE ELAST BULKY 4 IN (GAUZE/BANDAGES/DRESSINGS) IMPLANT
BLADE MINI RND TIP GREEN BEAV (BLADE) IMPLANT
BLADE SURG 15 STRL LF DISP TIS (BLADE) ×1 IMPLANT
BLADE SURG 15 STRL SS (BLADE) ×1
BNDG COHESIVE 1X5 TAN STRL LF (GAUZE/BANDAGES/DRESSINGS) ×2 IMPLANT
BNDG COHESIVE 3X5 TAN STRL LF (GAUZE/BANDAGES/DRESSINGS) IMPLANT
BNDG ELASTIC 2 VLCR STRL LF (GAUZE/BANDAGES/DRESSINGS) IMPLANT
BNDG ESMARK 4X9 LF (GAUZE/BANDAGES/DRESSINGS) ×2 IMPLANT
BRUSH SCRUB EZ PLAIN DRY (MISCELLANEOUS) ×2 IMPLANT
CLOTH BEACON ORANGE TIMEOUT ST (SAFETY) IMPLANT
CORDS BIPOLAR (ELECTRODE) ×2 IMPLANT
COVER MAYO STAND STRL (DRAPES) ×2 IMPLANT
COVER TABLE BACK 60X90 (DRAPES) ×2 IMPLANT
CUFF TOURNIQUET SINGLE 18IN (TOURNIQUET CUFF) ×2 IMPLANT
DECANTER SPIKE VIAL GLASS SM (MISCELLANEOUS) IMPLANT
DRAIN PENROSE 1/2X12 LTX STRL (WOUND CARE) IMPLANT
DRAIN PENROSE 1/4X12 LTX STRL (WOUND CARE) IMPLANT
DRAPE EXTREMITY T 121X128X90 (DRAPE) ×2 IMPLANT
DRAPE SURG 17X23 STRL (DRAPES) ×2 IMPLANT
GAUZE XEROFORM 1X8 LF (GAUZE/BANDAGES/DRESSINGS) IMPLANT
GLOVE BIOGEL M STRL SZ7.5 (GLOVE) IMPLANT
GLOVE BIOGEL PI IND STRL 7.0 (GLOVE) ×1 IMPLANT
GLOVE BIOGEL PI INDICATOR 7.0 (GLOVE) ×1
GLOVE ECLIPSE 6.5 STRL STRAW (GLOVE) ×2 IMPLANT
GLOVE EXAM NITRILE MD LF STRL (GLOVE) ×2 IMPLANT
GLOVE ORTHO TXT STRL SZ7.5 (GLOVE) ×2 IMPLANT
GOWN BRE IMP PREV XXLGXLNG (GOWN DISPOSABLE) ×2 IMPLANT
GOWN PREVENTION PLUS XLARGE (GOWN DISPOSABLE) ×2 IMPLANT
NEEDLE 27GAX1X1/2 (NEEDLE) ×2 IMPLANT
NEEDLE BLUNT 17GA (NEEDLE) IMPLANT
NS IRRIG 1000ML POUR BTL (IV SOLUTION) ×2 IMPLANT
PACK BASIN DAY SURGERY FS (CUSTOM PROCEDURE TRAY) ×2 IMPLANT
PADDING CAST ABS 4INX4YD NS (CAST SUPPLIES)
PADDING CAST ABS COTTON 4X4 ST (CAST SUPPLIES) IMPLANT
PADDING UNDERCAST 2  STERILE (CAST SUPPLIES) IMPLANT
SPONGE GAUZE 4X4 12PLY (GAUZE/BANDAGES/DRESSINGS) IMPLANT
STOCKINETTE 4X48 STRL (DRAPES) ×2 IMPLANT
STRIP CLOSURE SKIN 1/2X4 (GAUZE/BANDAGES/DRESSINGS) ×2 IMPLANT
SUT ETHILON 5 0 P 3 18 (SUTURE)
SUT MERSILENE 4 0 P 3 (SUTURE) IMPLANT
SUT NYLON ETHILON 5-0 P-3 1X18 (SUTURE) IMPLANT
SUT PROLENE 3 0 PS 2 (SUTURE) IMPLANT
SYR 20CC LL (SYRINGE) IMPLANT
SYR 3ML 23GX1 SAFETY (SYRINGE) IMPLANT
SYR CONTROL 10ML LL (SYRINGE) ×2 IMPLANT
TOWEL OR 17X24 6PK STRL BLUE (TOWEL DISPOSABLE) ×2 IMPLANT
TRAY DSU PREP LF (CUSTOM PROCEDURE TRAY) ×2 IMPLANT
UNDERPAD 30X30 INCONTINENT (UNDERPADS AND DIAPERS) ×2 IMPLANT

## 2013-05-01 NOTE — Op Note (Signed)
NAME:  Diane Bryant, Maysa            ACCOUNT NO.:  [id number]  MEDICAL RECORD NO.:  [id number]  LOCATION:  WA02                         FACILITY:  [facility]  PHYSICIAN:  Kayra Crowell V. Maylynn Orzechowski, M.D. DATE OF BIRTH:  07/11/1956  DATE OF PROCEDURE:  05/01/2013 DATE OF DISCHARGE:  05/01/2013                              OPERATIVE REPORT   PREOPERATIVE DIAGNOSIS:  Enlarging mass dorsal radial aspect of right thumb just proximal to metacarpophalangeal joint, rule out myxoid cyst versus atypical lipoma.  POSTOPERATIVE DIAGNOSIS:  Atypical lipoma versus schwannoma.  OPERATION:  Marginal resection of mass, dorsal aspect, right thumb.  OPERATING SURGEON:  Joanann Mies V. Dashonda Bonneau, MD  ASSISTANT:  Surgical technician.  ANESTHESIA:  General by LMA.  SUPERVISING ANESTHESIOLOGIST:  Charles E. Frederick, MD  INDICATIONS:  Diane Bryant is a 57-year-old woman, self referred for evaluation and management of a mass on the dorsal aspect of her right thumb.  Mrs. Diane Bryant myself have been acquainted for over 30 years as we attended a scuba diving course many years ago in the early 1980s.  She had a history of gradually enlarging mass on dorsal aspect of her thumb that did transilluminate.  We initially thought this might be a myxoid cyst versus a lipoma.  She requested this be excised.  We recommended excisional biopsy for likely resolution of the problem as well as correct diagnosis.  After informed consent, she was brought to the operating room at this time.  Preoperatively, she was interviewed by Dr. Frederick.  A full spectrum of anesthesia choices was recommended, ultimately a brief general anesthesia by LMA technique was accepted due to the use of a proximal brachial tourniquet.  After informed consent, she was brought to the operating at this time.  DESCRIPTION OF PROCEDURE:  Zettie Diane Bryant was brought to room 2 of the [facility] and placed in supine position on the operating  room table.  Following the induction of general anesthesia by LMA technique, the right hand and arm were prepped with Betadine soap and solution, sterilely draped.  A pneumatic tourniquet was applied to the proximal right brachium.  The right hand had been marked per protocol with a marking pen in the holding area.  Following routine Betadine scrub and paint of the right upper extremity, sterile stockinette and impervious arthroscopy drapes were applied followed by exsanguination of the right hand and arm with Esmarch bandage, inflation of the arterial tourniquet to 220 mmHg.  Following routine surgical time-out, the procedure commenced with an oblique incision directly over the apex of the mass.  Subcutaneous tissues were carefully divided revealing a vascular yellowish gray mass consistent with either a schwannoma or a typical lipoma.  This was not a myxoid cyst and did not directly communicate with the MP joint.  This was circumferentially dissected and a bipolar cautery was used to resect veins and a small sensory nerve branch that was attached to the mass.  I suspect this will turn out to be a variant of a schwannoma.  This was passed off in formalin and sent for pathologic evaluation.  The wound was then repaired with intradermal 4-0 Prolene.  A Steri-Strip was applied followed by infiltrating the wound margins with 2% lidocaine   for postoperative comfort.  For aftercare, [name] is provided a prescription for Tylenol with Codeine No. 3 one tablet p.o. q.4-6 hours p.r.n. pain, 20 tablets without refill.     Raea Magallon V. Kayley Zeiders, M.D.     RVS/MEDQ  D:  05/01/2013  T:  05/01/2013  Job:  [id number] 

## 2013-05-01 NOTE — Op Note (Signed)
[id number] 

## 2013-05-01 NOTE — Anesthesia Postprocedure Evaluation (Signed)
Anesthesia Post Note  Patient: Diane Bryant  Procedure(s) Performed: Procedure(s) (LRB): EXCISIONAL BIOPSY OF RIGHT THUMB (Right)  Anesthesia type: General  Patient location: PACU  Post pain: Pain level controlled  Post assessment: Patient's Cardiovascular Status Stable  Last Vitals:  Filed Vitals:   05/01/13 0935  BP: 115/59  Pulse: 63  Temp: 36.7 C  Resp: 16    Post vital signs: Reviewed and stable  Level of consciousness: alert  Complications: No apparent anesthesia complications

## 2013-05-01 NOTE — Transfer of Care (Signed)
Immediate Anesthesia Transfer of Care Note  Patient: Diane Bryant  Procedure(s) Performed: Procedure(s): EXCISIONAL BIOPSY OF RIGHT THUMB (Right)  Patient Location: PACU  Anesthesia Type:General  Level of Consciousness: sedated and patient cooperative  Airway & Oxygen Therapy: Patient Spontanous Breathing and Patient connected to face mask oxygen  Post-op Assessment: Report given to PACU RN and Post -op Vital signs reviewed and stable  Post vital signs: Reviewed and stable  Complications: No apparent anesthesia complications

## 2013-05-01 NOTE — Op Note (Deleted)
NAMEVANITY, LARSSON            ACCOUNT NO.:  [id number]  MEDICAL RECORD NO.:  [id number]  LOCATION:  WA02                         FACILITY:  [facility]  PHYSICIAN:  Diane Fitch. Loris Winrow, M.D. DATE OF BIRTH:  January 14, 1956  DATE OF PROCEDURE:  05/01/2013 DATE OF DISCHARGE:  05/01/2013                              OPERATIVE REPORT   PREOPERATIVE DIAGNOSIS:  Enlarging mass dorsal radial aspect of right thumb just proximal to metacarpophalangeal joint, rule out myxoid cyst versus atypical lipoma.  POSTOPERATIVE DIAGNOSIS:  Atypical lipoma versus schwannoma.  OPERATION:  Marginal resection of mass, dorsal aspect, right thumb.  OPERATING SURGEON:  Diane Fitch. Daielle Melcher, MD  ASSISTANT:  Surgical technician.  ANESTHESIA:  General by LMA.  SUPERVISING ANESTHESIOLOGIST:  Janetta Hora. Gelene Mink, MD  INDICATIONS:  Diane Bryant is a 57 year old woman, self referred for evaluation and management of a mass on the dorsal aspect of her right thumb.  [name] myself have been acquainted for over 30 years as we attended a scuba diving course many years ago in the early 1980s.  She had a history of gradually enlarging mass on dorsal aspect of her thumb that did transilluminate.  We initially thought this might be a myxoid cyst versus a lipoma.  She requested this be excised.  We recommended excisional biopsy for likely resolution of the problem as well as correct diagnosis.  After informed consent, she was brought to the operating room at this time.  Preoperatively, she was interviewed by Dr. Gelene Mink.  A full spectrum of anesthesia choices was recommended, ultimately a brief general anesthesia by LMA technique was accepted due to the use of a proximal brachial tourniquet.  After informed consent, she was brought to the operating at this time.  DESCRIPTION OF PROCEDURE:  Diane Bryant was brought to room 2 of the [facility] and placed in supine position on the operating  room table.  Following the induction of general anesthesia by LMA technique, the right hand and arm were prepped with Betadine soap and solution, sterilely draped.  A pneumatic tourniquet was applied to the proximal right brachium.  The right hand had been marked per protocol with a marking pen in the holding area.  Following routine Betadine scrub and paint of the right upper extremity, sterile stockinette and impervious arthroscopy drapes were applied followed by exsanguination of the right hand and arm with Esmarch bandage, inflation of the arterial tourniquet to 220 mmHg.  Following routine surgical time-out, the procedure commenced with an oblique incision directly over the apex of the mass.  Subcutaneous tissues were carefully divided revealing a vascular yellowish gray mass consistent with either a schwannoma or a typical lipoma.  This was not a myxoid cyst and did not directly communicate with the MP joint.  This was circumferentially dissected and a bipolar cautery was used to resect veins and a small sensory nerve branch that was attached to the mass.  I suspect this will turn out to be a variant of a schwannoma.  This was passed off in formalin and sent for pathologic evaluation.  The wound was then repaired with intradermal 4-0 Prolene.  A Steri-Strip was applied followed by infiltrating the wound margins with 2% lidocaine  for postoperative comfort.  For aftercare, Diane Bryant is provided a prescription for Tylenol with Codeine No. 3 one tablet p.o. q.4-6 hours p.r.n. pain, 20 tablets without refill.     Diane Bryant, M.D.     RVS/MEDQ  D:  05/01/2013  T:  05/01/2013  Job:  [id number]

## 2013-05-01 NOTE — Brief Op Note (Signed)
05/01/2013  8:12 AM  PATIENT:  Diane Bryant  57 y.o. female  PRE-OPERATIVE DIAGNOSIS:  CYST RIGHT THUMB  POST-OPERATIVE DIAGNOSIS:  Schwannoma vs atypical lipoma of right thumb  PROCEDURE:  Procedure(s): EXCISIONAL BIOPSY OF RIGHT THUMB (Right)  SURGEON:  Surgeon(s) and Role:    * Wyn Forster., MD - Primary  PHYSICIAN ASSISTANT:   ASSISTANTS: surgical technician  ANESTHESIA:   general  EBL:     BLOOD ADMINISTERED:none  DRAINS: none   LOCAL MEDICATIONS USED:  LIDOCAINE   SPECIMEN:  Biopsy / Limited Resection  DISPOSITION OF SPECIMEN:  PATHOLOGY  COUNTS:  YES  TOURNIQUET:   Total Tourniquet Time Documented: Upper Arm (Right) - 6 minutes Total: Upper Arm (Right) - 6 minutes   DICTATION: .Other Dictation: Dictation Number [id number]  PLAN OF CARE: Discharge to home after PACU  PATIENT DISPOSITION:  PACU - hemodynamically stable.   Delay start of Pharmacological VTE agent (>24hrs) due to surgical blood loss or risk of bleeding: not applicable

## 2013-05-01 NOTE — Anesthesia Preprocedure Evaluation (Addendum)
Anesthesia Evaluation  Patient identified by MRN, date of birth, ID band Patient awake    Reviewed: Allergy & Precautions, H&P , NPO status , Patient's Chart, lab work & pertinent test results, reviewed documented beta blocker date and time   Airway Mallampati: II TM Distance: >3 FB Neck ROM: full    Dental   Pulmonary neg pulmonary ROS,  breath sounds clear to auscultation        Cardiovascular + Valvular Problems/Murmurs Rhythm:regular     Neuro/Psych PSYCHIATRIC DISORDERS Anxiety  Neuromuscular disease    GI/Hepatic negative GI ROS, Neg liver ROS,   Endo/Other  negative endocrine ROS  Renal/GU negative Renal ROS  negative genitourinary   Musculoskeletal   Abdominal   Peds  Hematology negative hematology ROS (+)   Anesthesia Other Findings See surgeon's H&P   Reproductive/Obstetrics negative OB ROS                          Anesthesia Physical Anesthesia Plan  ASA: II  Anesthesia Plan: MAC, Bier Block and General   Post-op Pain Management:    Induction:   Airway Management Planned: Simple Face Mask and LMA  Additional Equipment:   Intra-op Plan:   Post-operative Plan:   Informed Consent: I have reviewed the patients History and Physical, chart, labs and discussed the procedure including the risks, benefits and alternatives for the proposed anesthesia with the patient or authorized representative who has indicated his/her understanding and acceptance.   Dental Advisory Given  Plan Discussed with: CRNA and Surgeon  Anesthesia Plan Comments:        Anesthesia Quick Evaluation

## 2013-05-05 ENCOUNTER — Encounter (HOSPITAL_BASED_OUTPATIENT_CLINIC_OR_DEPARTMENT_OTHER): Payer: Self-pay | Admitting: Orthopedic Surgery

## 2013-05-22 ENCOUNTER — Other Ambulatory Visit: Payer: Self-pay

## 2013-05-26 ENCOUNTER — Encounter (HOSPITAL_BASED_OUTPATIENT_CLINIC_OR_DEPARTMENT_OTHER): Payer: Self-pay | Admitting: Orthopedic Surgery

## 2013-10-13 ENCOUNTER — Other Ambulatory Visit: Payer: Self-pay | Admitting: *Deleted

## 2014-01-12 ENCOUNTER — Other Ambulatory Visit: Payer: Self-pay | Admitting: Gastroenterology

## 2014-01-12 DIAGNOSIS — R109 Unspecified abdominal pain: Secondary | ICD-10-CM

## 2014-01-12 DIAGNOSIS — R197 Diarrhea, unspecified: Secondary | ICD-10-CM

## 2014-01-19 ENCOUNTER — Ambulatory Visit
Admission: RE | Admit: 2014-01-19 | Discharge: 2014-01-19 | Disposition: A | Discharge status: Home / Self Care | Payer: BC Managed Care – PPO | Source: Ambulatory Visit | Attending: Gastroenterology | Admitting: Gastroenterology

## 2014-01-19 DIAGNOSIS — R109 Unspecified abdominal pain: Secondary | ICD-10-CM

## 2014-01-19 DIAGNOSIS — R197 Diarrhea, unspecified: Secondary | ICD-10-CM

## 2014-01-19 MED ORDER — IOHEXOL 300 MG/ML  SOLN
100.0000 mL | Freq: Once | INTRAMUSCULAR | Status: AC | PRN
  Administered 2014-01-19: 100 mL via INTRAVENOUS

## 2017-02-02 ENCOUNTER — Other Ambulatory Visit: Payer: Self-pay | Admitting: Orthopedic Surgery

## 2017-02-07 ENCOUNTER — Encounter (HOSPITAL_BASED_OUTPATIENT_CLINIC_OR_DEPARTMENT_OTHER): Payer: Self-pay | Admitting: *Deleted

## 2017-02-13 ENCOUNTER — Ambulatory Visit (HOSPITAL_BASED_OUTPATIENT_CLINIC_OR_DEPARTMENT_OTHER)
Admission: RE | Admit: 2017-02-13 | Discharge: 2017-02-13 | Disposition: A | Discharge status: Home / Self Care | Payer: BLUE CROSS/BLUE SHIELD | Source: Ambulatory Visit | Attending: Orthopedic Surgery | Admitting: Orthopedic Surgery

## 2017-02-13 ENCOUNTER — Encounter (HOSPITAL_BASED_OUTPATIENT_CLINIC_OR_DEPARTMENT_OTHER)
Admission: RE | Disposition: A | Discharge status: Home / Self Care | Payer: Self-pay | Source: Ambulatory Visit | Attending: Orthopedic Surgery

## 2017-02-13 ENCOUNTER — Ambulatory Visit (HOSPITAL_BASED_OUTPATIENT_CLINIC_OR_DEPARTMENT_OTHER): Payer: BLUE CROSS/BLUE SHIELD | Admitting: Certified Registered"

## 2017-02-13 ENCOUNTER — Encounter (HOSPITAL_BASED_OUTPATIENT_CLINIC_OR_DEPARTMENT_OTHER): Payer: Self-pay | Admitting: *Deleted

## 2017-02-13 DIAGNOSIS — Z87891 Personal history of nicotine dependence: Secondary | ICD-10-CM | POA: Insufficient documentation

## 2017-02-13 DIAGNOSIS — F419 Anxiety disorder, unspecified: Secondary | ICD-10-CM | POA: Diagnosis not present

## 2017-02-13 DIAGNOSIS — Z8261 Family history of arthritis: Secondary | ICD-10-CM | POA: Diagnosis not present

## 2017-02-13 DIAGNOSIS — M180 Bilateral primary osteoarthritis of first carpometacarpal joints: Secondary | ICD-10-CM | POA: Diagnosis not present

## 2017-02-13 DIAGNOSIS — Z888 Allergy status to other drugs, medicaments and biological substances status: Secondary | ICD-10-CM | POA: Diagnosis not present

## 2017-02-13 DIAGNOSIS — E039 Hypothyroidism, unspecified: Secondary | ICD-10-CM | POA: Insufficient documentation

## 2017-02-13 DIAGNOSIS — Z885 Allergy status to narcotic agent status: Secondary | ICD-10-CM | POA: Insufficient documentation

## 2017-02-13 DIAGNOSIS — F1290 Cannabis use, unspecified, uncomplicated: Secondary | ICD-10-CM | POA: Insufficient documentation

## 2017-02-13 DIAGNOSIS — G250 Essential tremor: Secondary | ICD-10-CM | POA: Insufficient documentation

## 2017-02-13 DIAGNOSIS — K219 Gastro-esophageal reflux disease without esophagitis: Secondary | ICD-10-CM | POA: Diagnosis not present

## 2017-02-13 DIAGNOSIS — Z880 Allergy status to penicillin: Secondary | ICD-10-CM | POA: Insufficient documentation

## 2017-02-13 HISTORY — DX: Hypothyroidism, unspecified: E03.9

## 2017-02-13 HISTORY — DX: Primary osteoarthritis, unspecified hand: M19.049

## 2017-02-13 HISTORY — DX: Gastro-esophageal reflux disease without esophagitis: K21.9

## 2017-02-13 HISTORY — PX: CARPOMETACARPEL SUSPENSION PLASTY: SHX5005

## 2017-02-13 HISTORY — DX: Unspecified osteoarthritis, unspecified site: M19.90

## 2017-02-13 SURGERY — CARPOMETACARPEL (CMC) SUSPENSION PLASTY
Anesthesia: General | Site: Hand | Laterality: Right

## 2017-02-13 MED ORDER — BUPIVACAINE-EPINEPHRINE (PF) 0.5% -1:200000 IJ SOLN
INTRAMUSCULAR | Status: DC | PRN
  Administered 2017-02-13: 20 mL via PERINEURAL

## 2017-02-13 MED ORDER — LIDOCAINE HCL (CARDIAC) 20 MG/ML IV SOLN
INTRAVENOUS | Status: DC | PRN
  Administered 2017-02-13: 60 mg via INTRAVENOUS

## 2017-02-13 MED ORDER — FENTANYL CITRATE (PF) 100 MCG/2ML IJ SOLN
50.0000 ug | INTRAMUSCULAR | Status: DC | PRN
  Administered 2017-02-13: 50 ug via INTRAVENOUS

## 2017-02-13 MED ORDER — FENTANYL CITRATE (PF) 100 MCG/2ML IJ SOLN
INTRAMUSCULAR | Status: AC
  Filled 2017-02-13: qty 2

## 2017-02-13 MED ORDER — PROPOFOL 10 MG/ML IV BOLUS
INTRAVENOUS | Status: DC | PRN
Start: 2017-02-13 — End: 2017-02-13
  Administered 2017-02-13: 100 mg via INTRAVENOUS

## 2017-02-13 MED ORDER — SCOPOLAMINE 1 MG/3DAYS TD PT72
1.0000 | MEDICATED_PATCH | Freq: Once | TRANSDERMAL | Status: DC | PRN
  Administered 2017-02-13: 1.5 mg via TRANSDERMAL

## 2017-02-13 MED ORDER — CHLORHEXIDINE GLUCONATE 4 % EX LIQD: 60.0000 mL | Freq: Once | CUTANEOUS | Status: DC

## 2017-02-13 MED ORDER — MIDAZOLAM HCL 2 MG/2ML IJ SOLN
INTRAMUSCULAR | Status: AC
  Filled 2017-02-13: qty 2

## 2017-02-13 MED ORDER — SCOPOLAMINE 1 MG/3DAYS TD PT72
MEDICATED_PATCH | TRANSDERMAL | Status: AC
  Filled 2017-02-13: qty 1

## 2017-02-13 MED ORDER — ONDANSETRON HCL 4 MG/2ML IJ SOLN
INTRAMUSCULAR | Status: DC | PRN
  Administered 2017-02-13: 4 mg via INTRAVENOUS

## 2017-02-13 MED ORDER — MIDAZOLAM HCL 2 MG/2ML IJ SOLN
1.0000 mg | INTRAMUSCULAR | Status: DC | PRN
  Administered 2017-02-13: 2 mg via INTRAVENOUS

## 2017-02-13 MED ORDER — LACTATED RINGERS IV SOLN
INTRAVENOUS | Status: DC
  Administered 2017-02-13 (×2): via INTRAVENOUS

## 2017-02-13 MED ORDER — VANCOMYCIN HCL IN DEXTROSE 1-5 GM/200ML-% IV SOLN
1000.0000 mg | INTRAVENOUS | Status: AC
  Administered 2017-02-13: 1000 mg via INTRAVENOUS

## 2017-02-13 MED ORDER — TRAMADOL HCL 50 MG PO TABS: 50.0000 mg | ORAL_TABLET | Freq: Four times a day (QID) | ORAL | 0 refills | Status: AC | PRN

## 2017-02-13 SURGICAL SUPPLY — 71 items
BIT DRILL 7/64X5 DISP (BIT) ×4 IMPLANT
BLADE ARTHRO LOK 4 BEAVER (BLADE) IMPLANT
BLADE ARTHRO LOK 4MM BEAVER (BLADE)
BLADE MINI RND TIP GREEN BEAV (BLADE) ×4 IMPLANT
BLADE SURG 15 STRL LF DISP TIS (BLADE) ×2 IMPLANT
BLADE SURG 15 STRL SS (BLADE) ×2
BNDG COHESIVE 3X5 TAN STRL LF (GAUZE/BANDAGES/DRESSINGS) ×4 IMPLANT
BNDG ESMARK 4X9 LF (GAUZE/BANDAGES/DRESSINGS) ×4 IMPLANT
BNDG GAUZE ELAST 4 BULKY (GAUZE/BANDAGES/DRESSINGS) ×4 IMPLANT
BUR EGG 3PK/BX (BURR) IMPLANT
CHLORAPREP W/TINT 26ML (MISCELLANEOUS) ×4 IMPLANT
CLOSURE WOUND 1/4X4 (GAUZE/BANDAGES/DRESSINGS)
CORD BIPOLAR FORCEPS 12FT (ELECTRODE) ×4 IMPLANT
COVER BACK TABLE 60X90IN (DRAPES) ×4 IMPLANT
COVER MAYO STAND STRL (DRAPES) ×4 IMPLANT
CUFF TOURNIQUET SINGLE 18IN (TOURNIQUET CUFF) ×4 IMPLANT
DECANTER SPIKE VIAL GLASS SM (MISCELLANEOUS) IMPLANT
DRAPE EXTREMITY T 121X128X90 (DRAPE) ×4 IMPLANT
DRAPE OEC MINIVIEW 54X84 (DRAPES) ×4 IMPLANT
DRAPE SURG 17X23 STRL (DRAPES) ×4 IMPLANT
GAUZE SPONGE 4X4 12PLY STRL (GAUZE/BANDAGES/DRESSINGS) ×4 IMPLANT
GAUZE SPONGE 4X4 16PLY XRAY LF (GAUZE/BANDAGES/DRESSINGS) IMPLANT
GAUZE XEROFORM 1X8 LF (GAUZE/BANDAGES/DRESSINGS) ×4 IMPLANT
GLOVE BIOGEL PI IND STRL 7.0 (GLOVE) ×4 IMPLANT
GLOVE BIOGEL PI IND STRL 8.5 (GLOVE) ×2 IMPLANT
GLOVE BIOGEL PI INDICATOR 7.0 (GLOVE) ×4
GLOVE BIOGEL PI INDICATOR 8.5 (GLOVE) ×2
GLOVE ECLIPSE 6.5 STRL STRAW (GLOVE) ×4 IMPLANT
GLOVE SURG ORTHO 8.0 STRL STRW (GLOVE) ×4 IMPLANT
GOWN STRL REUS W/ TWL LRG LVL3 (GOWN DISPOSABLE) ×2 IMPLANT
GOWN STRL REUS W/TWL LRG LVL3 (GOWN DISPOSABLE) ×2
GOWN STRL REUS W/TWL XL LVL3 (GOWN DISPOSABLE) ×4 IMPLANT
NDL SUT 6 .5 CRC .975X.05 MAYO (NEEDLE) IMPLANT
NEEDLE KEITH (NEEDLE) IMPLANT
NEEDLE MAYO TAPER (NEEDLE)
NEEDLE PRECISIONGLIDE 27X1.5 (NEEDLE) IMPLANT
NS IRRIG 1000ML POUR BTL (IV SOLUTION) ×4 IMPLANT
PACK BASIN DAY SURGERY FS (CUSTOM PROCEDURE TRAY) ×4 IMPLANT
PAD CAST 3X4 CTTN HI CHSV (CAST SUPPLIES) ×2 IMPLANT
PADDING CAST ABS 3INX4YD NS (CAST SUPPLIES)
PADDING CAST ABS 4INX4YD NS (CAST SUPPLIES)
PADDING CAST ABS COTTON 3X4 (CAST SUPPLIES) IMPLANT
PADDING CAST ABS COTTON 4X4 ST (CAST SUPPLIES) IMPLANT
PADDING CAST COTTON 3X4 STRL (CAST SUPPLIES) ×2
RUBBERBAND STERILE (MISCELLANEOUS) IMPLANT
SLEEVE SCD COMPRESS KNEE MED (MISCELLANEOUS) ×4 IMPLANT
SLING ARM FOAM STRAP LRG (SOFTGOODS) ×4 IMPLANT
SPLINT PLASTER CAST XFAST 3X15 (CAST SUPPLIES) ×20 IMPLANT
SPLINT PLASTER XTRA FASTSET 3X (CAST SUPPLIES) ×20
STOCKINETTE 4X48 STRL (DRAPES) ×4 IMPLANT
STRIP CLOSURE SKIN 1/4X4 (GAUZE/BANDAGES/DRESSINGS) IMPLANT
SUT ETHIBOND 2 OS 4 DA (SUTURE) IMPLANT
SUT ETHIBOND 3-0 V-5 (SUTURE) ×4 IMPLANT
SUT ETHILON 4 0 PS 2 18 (SUTURE) ×4 IMPLANT
SUT FIBERWIRE #2 38 T-5 BLUE (SUTURE)
SUT FIBERWIRE 2-0 18 17.9 3/8 (SUTURE)
SUT FIBERWIRE 4-0 18 DIAM BLUE (SUTURE) ×4
SUT MERSILENE 4 0 P 3 (SUTURE) IMPLANT
SUT STEEL 3 0 (SUTURE) ×8 IMPLANT
SUT STEEL 4 0 (SUTURE) IMPLANT
SUT VIC AB 4-0 P-3 18XBRD (SUTURE) ×2 IMPLANT
SUT VIC AB 4-0 P2 18 (SUTURE) IMPLANT
SUT VIC AB 4-0 P3 18 (SUTURE) ×2
SUTURE FIBERWR #2 38 T-5 BLUE (SUTURE) IMPLANT
SUTURE FIBERWR 2-0 18 17.9 3/8 (SUTURE) IMPLANT
SUTURE FIBERWR 4-0 18 DIA BLUE (SUTURE) ×2 IMPLANT
SYR BULB 3OZ (MISCELLANEOUS) ×4 IMPLANT
SYR CONTROL 10ML LL (SYRINGE) IMPLANT
TOWEL OR 17X24 6PK STRL BLUE (TOWEL DISPOSABLE) ×8 IMPLANT
TOWEL OR NON WOVEN STRL DISP B (DISPOSABLE) ×4 IMPLANT
UNDERPAD 30X30 (UNDERPADS AND DIAPERS) IMPLANT

## 2017-02-13 NOTE — Transfer of Care (Signed)
Immediate Anesthesia Transfer of Care Note  Patient: Diane Bryant  Procedure(s) Performed: Procedure(s): EXCISION TRAPEZIUM ABDUCTOR POLLICIS LONGUS TRANSFER RIGHT THUMB (Right)  Patient Location: PACU  Anesthesia Type:GA combined with regional for post-op pain  Level of Consciousness: awake, alert , oriented and patient cooperative  Airway & Oxygen Therapy: Patient Spontanous Breathing and Patient connected to face mask oxygen  Post-op Assessment: Report given to RN and Post -op Vital signs reviewed and stable  Post vital signs: Reviewed and stable  Last Vitals:  Vitals:   02/13/17 0915 02/13/17 0930  BP:    Pulse: 67 86  Resp: 14   Temp:      Last Pain:  Vitals:   02/13/17 0837  TempSrc: Oral      Patients Stated Pain Goal: 0 (19/01/22 2411)  Complications: No apparent anesthesia complications

## 2017-02-13 NOTE — Op Note (Signed)
Dictation Number [id number]

## 2017-02-13 NOTE — Brief Op Note (Signed)
02/13/2017  11:01 AM  PATIENT:  Diane Bryant  61 y.o. female  PRE-OPERATIVE DIAGNOSIS:  CARPOMETACARPAL ARTHRITIS RIGHT THUMB  POST-OPERATIVE DIAGNOSIS:  CARPOMETACARPAL ARTHRITIS RIGHT THUMB  PROCEDURE:  Procedure(s): EXCISION TRAPEZIUM ABDUCTOR POLLICIS LONGUS TRANSFER RIGHT THUMB (Right)  SURGEON:  Surgeon(s) and Role:    * Daryll Brod, MD - Primary  PHYSICIAN ASSISTANT:   ASSISTANTS: none   ANESTHESIA:   regional and IV sedation  EBL:  Total I/O In: 1000 [I.V.:1000] Out: -   BLOOD ADMINISTERED:none  DRAINS: none   LOCAL MEDICATIONS USED:  NONE  SPECIMEN:  No Specimen  DISPOSITION OF SPECIMEN:  N/A  COUNTS:  YES  TOURNIQUET:   Total Tourniquet Time Documented: Upper Arm (Right) - 68 minutes Total: Upper Arm (Right) - 68 minutes   DICTATION: .Other Dictation: Dictation Number [id number]  PLAN OF CARE: Discharge to home after PACU  PATIENT DISPOSITION:  PACU - hemodynamically stable.

## 2017-02-13 NOTE — Anesthesia Postprocedure Evaluation (Signed)
Anesthesia Post Note  Patient: Diane Bryant  Procedure(s) Performed: Procedure(s) (LRB): EXCISION TRAPEZIUM ABDUCTOR POLLICIS LONGUS TRANSFER RIGHT THUMB (Right)     Patient location during evaluation: PACU Anesthesia Type: General Level of consciousness: awake and alert Pain management: pain level controlled Vital Signs Assessment: post-procedure vital signs reviewed and stable Respiratory status: spontaneous breathing, nonlabored ventilation and respiratory function stable Cardiovascular status: blood pressure returned to baseline and stable Postop Assessment: no signs of nausea or vomiting Anesthetic complications: no    Last Vitals:  Vitals:   02/13/17 1130 02/13/17 1201  BP: 114/69 113/64  Pulse: 75 74  Resp: 10 16  Temp:  36.7 C    Last Pain:  Vitals:   02/13/17 1201  TempSrc: Oral  PainSc: 0-No pain                 Catalina Gravel

## 2017-02-13 NOTE — Anesthesia Preprocedure Evaluation (Signed)
Anesthesia Evaluation  Patient identified by MRN, date of birth, ID band Patient awake    Reviewed: Allergy & Precautions, NPO status , Patient's Chart, lab work & pertinent test results  Airway Mallampati: II  TM Distance: >3 FB Neck ROM: Full    Dental  (+) Teeth Intact, Dental Advisory Given, Caps,    Pulmonary former smoker,    Pulmonary exam normal breath sounds clear to auscultation       Cardiovascular negative cardio ROS Normal cardiovascular exam Rhythm:Regular Rate:Normal     Neuro/Psych PSYCHIATRIC DISORDERS Anxiety Essential tremor     GI/Hepatic Neg liver ROS, GERD  ,  Endo/Other  Hypothyroidism   Renal/GU negative Renal ROS     Musculoskeletal  (+) Arthritis , Osteoarthritis,    Abdominal   Peds  Hematology negative hematology ROS (+)   Anesthesia Other Findings Day of surgery medications reviewed with the patient.  Reproductive/Obstetrics                             Anesthesia Physical Anesthesia Plan  ASA: II  Anesthesia Plan: General   Post-op Pain Management:  Regional for Post-op pain   Induction: Intravenous  PONV Risk Score and Plan: 3 and Ondansetron, Dexamethasone and Midazolam  Airway Management Planned: LMA  Additional Equipment:   Intra-op Plan:   Post-operative Plan: Extubation in OR  Informed Consent: I have reviewed the patients History and Physical, chart, labs and discussed the procedure including the risks, benefits and alternatives for the proposed anesthesia with the patient or authorized representative who has indicated his/her understanding and acceptance.   Dental advisory given  Plan Discussed with: CRNA  Anesthesia Plan Comments: (Risks/benefits of general anesthesia discussed with patient including risk of damage to teeth, lips, gum, and tongue, nausea/vomiting, allergic reactions to medications, and the possibility of heart attack,  stroke and death.  All patient questions answered.  Patient wishes to proceed.)        Anesthesia Quick Evaluation

## 2017-02-13 NOTE — Discharge Instructions (Signed)
Post Anesthesia Home Care Instructions  Activity: Get plenty of rest for the remainder of the day. A responsible individual must stay with you for 24 hours following the procedure.  For the next 24 hours, DO NOT: -Drive a car -Operate machinery -Drink alcoholic beverages -Take any medication unless instructed by your physician -Make any legal decisions or sign important papers.  Meals: Start with liquid foods such as gelatin or soup. Progress to regular foods as tolerated. Avoid greasy, spicy, heavy foods. If nausea and/or vomiting occur, drink only clear liquids until the nausea and/or vomiting subsides. Call your physician if vomiting continues.  Special Instructions/Symptoms: Your throat may feel dry or sore from the anesthesia or the breathing tube placed in your throat during surgery. If this causes discomfort, gargle with warm salt water. The discomfort should disappear within 24 hours.  If you had a scopolamine patch placed behind your ear for the management of post- operative nausea and/or vomiting:  1. The medication in the patch is effective for 72 hours, after which it should be removed.  Wrap patch in a tissue and discard in the trash. Wash hands thoroughly with soap and water. 2. You may remove the patch earlier than 72 hours if you experience unpleasant side effects which may include dry mouth, dizziness or visual disturbances. 3. Avoid touching the patch. Wash your hands with soap and water after contact with the patch.      Regional Anesthesia Blocks  1. Numbness or the inability to move the "blocked" extremity may last from 3-48 hours after placement. The length of time depends on the medication injected and your individual response to the medication. If the numbness is not going away after 48 hours, call your surgeon.  2. The extremity that is blocked will need to be protected until the numbness is gone and the  Strength has returned. Because you cannot feel it, you  will need to take extra care to avoid injury. Because it may be weak, you may have difficulty moving it or using it. You may not know what position it is in without looking at it while the block is in effect.  3. For blocks in the legs and feet, returning to weight bearing and walking needs to be done carefully. You will need to wait until the numbness is entirely gone and the strength has returned. You should be able to move your leg and foot normally before you try and bear weight or walk. You will need someone to be with you when you first try to ensure you do not fall and possibly risk injury.  4. Bruising and tenderness at the needle site are common side effects and will resolve in a few days.  5. Persistent numbness or new problems with movement should be communicated to the surgeon or the [facility] ([phone])/ [facility] ([phone]).    [facility] Center Instructions Hand Surgery  Wound Care: Keep your hand elevated above the level of your heart.  Do not allow it to dangle by your side.  Keep the dressing dry and do not remove it unless your doctor advises you to do so.  He will usually change it at the time of your post-op visit.  Moving your fingers is advised to stimulate circulation but will depend on the site of your surgery.  If you have a splint applied, your doctor will advise you regarding movement.  Activity: Do not drive or operate machinery today.  Rest today and   then you may return to your normal activity and work as indicated by your physician.  Diet:  Drink liquids today or eat a light diet.  You may resume a regular diet tomorrow.    General expectations: Pain for two to three days. Fingers may become slightly swollen.  Call your doctor if any of the following occur: Severe pain not relieved by pain medication. Elevated temperature. Dressing soaked with blood. Inability to move fingers. White or bluish color to fingers.  

## 2017-02-13 NOTE — Anesthesia Procedure Notes (Addendum)
Anesthesia Regional Block: Supraclavicular block   Pre-Anesthetic Checklist: ,, timeout performed, Correct Patient, Correct Site, Correct Laterality, Correct Procedure, Correct Position, site marked, Risks and benefits discussed,  Surgical consent,  Pre-op evaluation,  At surgeon's request and post-op pain management  Laterality: Right  Prep: chloraprep       Needles:  Injection technique: Single-shot  Needle Type: Echogenic Needle     Needle Length: 9cm  Needle Gauge: 21     Additional Needles:   Procedures: ultrasound guided,,,,,,,,  Narrative:  Start time: 02/13/2017 9:18 AM End time: 02/13/2017 9:23 AM Injection made incrementally with aspirations every 5 mL.  Performed by: Personally  Anesthesiologist: Catalina Gravel  Additional Notes: No pain on injection. No increased resistance to injection. Injection made in 5cc increments.  Good needle visualization.  Patient tolerated procedure well.

## 2017-02-13 NOTE — Op Note (Signed)
NAMEMarylen Bryant.:  [id number]  MEDICAL RECORD NO.:  [id number]  LOCATION:                                 FACILITY:  PHYSICIAN:  Daryll Brod, M.D.            DATE OF BIRTH:  DATE OF PROCEDURE:  02/13/2017 DATE OF DISCHARGE:                              OPERATIVE REPORT   PREOPERATIVE DIAGNOSIS:  Arthritis of carpometacarpal joint, right thumb.  POSTOPERATIVE DIAGNOSIS:  Arthritis of carpometacarpal joint, right thumb.  OPERATION:  Suspension plasty with excision trapezium, abductor pollicis longus tendon transfer, right thumb.  SURGEON:  Daryll Brod, MD.  ANESTHESIA:  Supraclavicular block with sedation.  PLACE OF SURGERY:  [facility].  ANESTHESIOLOGIST:  Gifford Shave.  HISTORY:  The patient is a 61 year old female with a history of pain at the basilar area of her thumb.  She has arthritis on x-rays, which has not responded to conservative treatment.  She has elected to undergo surgical reconstruction of the [facility] joint with excision trapezium, abductor pollicis longus tendon transfer.  Pre, peri and postoperative course have been discussed along with risks and complications.  She is aware there is no guarantee with surgery, the possibility of infection; recurrence of injury to arteries, nerves, tendons; incomplete relief of symptoms; dystrophy; possibility of breakage of the transfer.  In the preoperative area, the patient was seen, the extremity marked by both patient and surgeon.  Antibiotic given.  PROCEDURE IN DETAIL:  The patient was brought to the operating room. After a supraclavicular block was carried out in the preoperative area, general anesthetic was undertaken by the Anesthesia Department.  She was prepped and draped using ChloraPrep in supine position with the right arm free.  A 3-minute dry time was allowed.  Time-out was taken confirming the patient and procedure.  The limb was exsanguinated with an Esmarch bandage.   Tourniquet placed on the upper arm and was inflated to 250 mmHg.  A curvilinear incision was made over the base of the right thumb carpometacarpal joint area carried up along the abductor pollicis longus tendon.  This was carried down through subcutaneous tissue. Bleeders were electrocauterized with bipolar.  The radial nerve was identified and protected.  The dissection was carried down between the extensor pollicis longus, abductor pollicis longus tendons.  The joint was opened after identification of the radial artery proximally.  The STT joint was then opened.  The periosteal sleeve around the bone was then incised with blunt and sharp dissection, using [employer] fast streamer and a Beaver blade.  Great care was taken to protect the radial artery.  The trapezium was isolated.  This was removed in 2 pieces.  The area was further debrided.  Osteophytes from the volar ulnar aspect of the proximal metacarpal of the thumb were removed with a rongeur.  The most dorsal portion of the abductor pollicis longus tendon was then isolated. This was left attached to the base of the thumb metacarpal.  With traction, the muscle belly was identified proximally and incision was made, carried down to the adductor pollicis longus muscle.  A Carroll tendon retriever was then passed beneath the fascia through  the first dorsal extensor compartment, which allowed a monofilament wire to be passed from the dorsal aspect of the abductor pollicis longus and then was used as a cheese cutter to separate the most dorsal portion of the abductor pollicis longus back to the musculotendinous junction where it was transected.  The muscle was developed in tendon that pulled distally.  The incision was irrigated and closed with interrupted 4-0 nylon sutures.  A drill hole was then made in the dorsal radial aspect of the thumb metacarpal base and carried to the volar ulnar aspect and drilled with a 7/16 inch drill.  A second drill  hole was then placed in the base of the index metacarpal just distal to the articular facet of the thumb metacarpal and passed up dorsally to the ulnar aspect of the index metacarpal.  An incision was made where this came through the metacarpal base.  This was carried down and a monofilament wire done over, was then passed through this drill hole to the base of the index metacarpal radially.  The wires were then used to pass the abductor pollicis longus tendon and through the base of the thumb from dorsal radial to ulnar volar aspect and then to the metacarpal of the index and a volar to the dorsal aspect from radial to ulnar.  A hemostat was then used to develop a tunnel subperiosteally across the base of the index metacarpal.  This was allowed to grasp the tendon as the egress from the base of the index metacarpal and this was brought back into the wound where the trapezium was evacuated.  This was then twisted around the abducted pollicis longus where it entered into the base of the index metacarpal and sutured into position with multiple figure-of-eight through Ethibond sutures.  This firmly fixed the thumb from the index finger.  X-rays confirmed it to adequately suspended and with stress no further proximal migration was noted.  The wounds were irrigated with saline.  The dorsal incision over the index metacarpal was closed with interrupted 4-0 nylon.  The capsule was closed with interrupted 4-0 Vicryl sutures for excision of the trapezium.  The subcutaneous tissue was closed with interrupted 4-0 Vicryl and the skin with interrupted 4-0 nylon on the base of the thumb.  A sterile compressive dressing, dorsal palmar thumb spica splint was applied.  On deflation of the tourniquet, all fingers immediately pinked.  She was taken to the recovery room for observation in satisfactory condition.  She will be discharged home to return to the [facility] in 1 week, on  [place].          ______________________________ Daryll Brod, M.D.     GK/MEDQ  D:  02/13/2017  T:  02/13/2017  Job:  [id number]

## 2017-02-13 NOTE — H&P (Signed)
Diane Bryant is an 61 y.o. female.   Chief Complaint: thumb pain right HPI: Diane Bryant is a 61 year old female with  Diane Bryant arthritis right greater than left.  She has been wearing a splint which has given her some relief but not complete. She states that she now has continued pain swelling with a VAS score of 8-1/2-9/10 with the splint on.  Pain is at the basilar joint of her right thumb increase with use of the thumb and  Gripping. This  Has persisted and not responded to prolonged conservative treatment.  She has no history of diabetes thyroid problems arthritis gout. Family history is positive arthritis negative for diabetes thyroid problems and gout. Does not want to have injections or take nonsteroidal anti-inflammatories.      Past Medical History:  Diagnosis Date  . Allergy   . Anxiety   . Arthritis   . Benign essential tremor   . CMC arthritis    right thumb  . GERD (gastroesophageal reflux disease)    OTC meds prn  . Heart murmur    when pregnant-echo in quate-nothing showed up  . Hypotension   . Hypothyroidism   . Low BP   . Neuromuscular disorder ([place])   . Wears glasses     Past Surgical History:  Procedure Laterality Date  . EXCISION METACARPAL MASS Right 05/01/2013   Procedure: EXCISIONAL BIOPSY OF RIGHT THUMB;  Surgeon: Cammie Sickle., MD;  Location: [facility];  Service: Orthopedics;  Laterality: Right;  . HERNIA REPAIR  1990   bilat ing  . TUBAL LIGATION      History reviewed. No pertinent family history. Social History:  reports that she quit smoking about 43 years ago. She has never used smokeless tobacco. She reports that she uses drugs, including Marijuana. She reports that she does not drink alcohol.  Allergies:  Allergies  Allergen Reactions  . Cortisone     Cortisone eye drops  . Penicillins Swelling  . Percocet [Oxycodone-Acetaminophen] Other (See Comments)    Felt like face was drooping    No prescriptions prior to admission.     No results found for this or any previous visit (from the past 48 hour(s)).  No results found.   Pertinent items are noted in HPI.  Height 5' 5.5" (1.664 m), weight 81.6 kg (180 lb).  General appearance: alert, cooperative and appears stated age Head: Normocephalic, without obvious abnormality Neck: no JVD Resp: clear to auscultation bilaterally Cardio: regular rate and rhythm, S1, S2 normal, no murmur, click, rub or gallop GI: soft, non-tender; bowel sounds normal; no masses,  no organomegaly Extremities: pain right thumb Pulses: 2+ and symmetric Skin: Skin color, texture, turgor normal. No rashes or lesions Neurologic: Grossly normal Incision/Wound: n a  Assessment/Plan Assessment:  1. Primary osteoarthritis of first carpometacarpal joint of right hand  2. Primary osteoarthritis of both first carpometacarpal joints    Plan: She is desirous of proceeding to have this surgically corrected. Pre-peri-and postoperative course are discussed along with risk complications. She is where there is no guarantee to the surgery the possibility of infection recurrence injury to arteries nerves tendons complete relief symptoms and dystrophy. She is scheduled for suspension plasty right thumb via excision trapezium APL transfer. We have discussed the actual procedure with her. She is advised that she will be in a splint for 6 weeks in a soft splint for approximately 4 weeks. She will be advised against heavy pinching or gripping for approximately  8 weeks.      Lucine Bilski R 02/13/2017, 5:13 AM

## 2017-02-13 NOTE — Progress Notes (Signed)
Assisted Dr. Turk with right, ultrasound guided, supraclavicular block. Side rails up, monitors on throughout procedure. See vital signs in flow sheet. Tolerated Procedure well. 

## 2017-02-13 NOTE — Anesthesia Procedure Notes (Signed)
Procedure Name: LMA Insertion Date/Time: 02/13/2017 9:34 AM Performed by: Dabria Wadas D Pre-anesthesia Checklist: Patient identified, Emergency Drugs available, Suction available and Patient being monitored Patient Re-evaluated:Patient Re-evaluated prior to induction Oxygen Delivery Method: Circle system utilized Preoxygenation: Pre-oxygenation with 100% oxygen Induction Type: IV induction Ventilation: Mask ventilation without difficulty LMA: LMA inserted LMA Size: 3.0 Number of attempts: 1 Airway Equipment and Method: Bite block Placement Confirmation: positive ETCO2 Tube secured with: Tape Dental Injury: Teeth and Oropharynx as per pre-operative assessment

## 2017-02-14 ENCOUNTER — Encounter (HOSPITAL_BASED_OUTPATIENT_CLINIC_OR_DEPARTMENT_OTHER): Payer: Self-pay | Admitting: Orthopedic Surgery

## 2018-07-18 DIAGNOSIS — D2262 Melanocytic nevi of left upper limb, including shoulder: Secondary | ICD-10-CM | POA: Diagnosis not present

## 2018-07-18 DIAGNOSIS — L814 Other melanin hyperpigmentation: Secondary | ICD-10-CM | POA: Diagnosis not present

## 2018-07-18 DIAGNOSIS — L570 Actinic keratosis: Secondary | ICD-10-CM | POA: Diagnosis not present

## 2018-07-18 DIAGNOSIS — L820 Inflamed seborrheic keratosis: Secondary | ICD-10-CM | POA: Diagnosis not present

## 2018-07-18 DIAGNOSIS — L821 Other seborrheic keratosis: Secondary | ICD-10-CM | POA: Diagnosis not present

## 2018-07-18 DIAGNOSIS — L918 Other hypertrophic disorders of the skin: Secondary | ICD-10-CM | POA: Diagnosis not present

## 2018-08-01 DIAGNOSIS — G250 Essential tremor: Secondary | ICD-10-CM | POA: Diagnosis not present

## 2018-08-01 DIAGNOSIS — Z780 Asymptomatic menopausal state: Secondary | ICD-10-CM | POA: Diagnosis not present

## 2018-08-25 DIAGNOSIS — J039 Acute tonsillitis, unspecified: Secondary | ICD-10-CM | POA: Diagnosis not present

## 2018-08-25 DIAGNOSIS — R6889 Other general symptoms and signs: Secondary | ICD-10-CM | POA: Diagnosis not present

## 2018-08-25 DIAGNOSIS — Z20828 Contact with and (suspected) exposure to other viral communicable diseases: Secondary | ICD-10-CM | POA: Diagnosis not present

## 2018-08-25 DIAGNOSIS — J029 Acute pharyngitis, unspecified: Secondary | ICD-10-CM | POA: Diagnosis not present

## 2019-03-19 DIAGNOSIS — B078 Other viral warts: Secondary | ICD-10-CM | POA: Diagnosis not present

## 2019-03-19 DIAGNOSIS — L820 Inflamed seborrheic keratosis: Secondary | ICD-10-CM | POA: Diagnosis not present

## 2019-03-19 DIAGNOSIS — L821 Other seborrheic keratosis: Secondary | ICD-10-CM | POA: Diagnosis not present

## 2019-03-19 DIAGNOSIS — D1801 Hemangioma of skin and subcutaneous tissue: Secondary | ICD-10-CM | POA: Diagnosis not present

## 2019-04-07 DIAGNOSIS — G8929 Other chronic pain: Secondary | ICD-10-CM | POA: Diagnosis not present

## 2019-04-07 DIAGNOSIS — G4700 Insomnia, unspecified: Secondary | ICD-10-CM | POA: Diagnosis not present

## 2019-04-07 DIAGNOSIS — M549 Dorsalgia, unspecified: Secondary | ICD-10-CM | POA: Diagnosis not present

## 2019-04-09 DIAGNOSIS — N390 Urinary tract infection, site not specified: Secondary | ICD-10-CM | POA: Diagnosis not present

## 2019-04-17 DIAGNOSIS — M549 Dorsalgia, unspecified: Secondary | ICD-10-CM | POA: Diagnosis not present

## 2019-04-17 DIAGNOSIS — G8929 Other chronic pain: Secondary | ICD-10-CM | POA: Diagnosis not present

## 2019-04-17 DIAGNOSIS — Z0001 Encounter for general adult medical examination with abnormal findings: Secondary | ICD-10-CM | POA: Diagnosis not present

## 2019-04-18 DIAGNOSIS — Z1231 Encounter for screening mammogram for malignant neoplasm of breast: Secondary | ICD-10-CM | POA: Diagnosis not present

## 2019-06-08 ENCOUNTER — Emergency Department (HOSPITAL_COMMUNITY)
Admission: EM | Admit: 2019-06-08 | Discharge: 2019-06-08 | Disposition: A | Discharge status: Home / Self Care | Payer: BC Managed Care – PPO | Attending: Emergency Medicine | Admitting: Emergency Medicine

## 2019-06-08 ENCOUNTER — Other Ambulatory Visit: Payer: Self-pay

## 2019-06-08 ENCOUNTER — Emergency Department (HOSPITAL_COMMUNITY): Payer: BC Managed Care – PPO

## 2019-06-08 ENCOUNTER — Encounter (HOSPITAL_COMMUNITY): Payer: Self-pay

## 2019-06-08 DIAGNOSIS — N281 Cyst of kidney, acquired: Secondary | ICD-10-CM | POA: Diagnosis not present

## 2019-06-08 DIAGNOSIS — Z79899 Other long term (current) drug therapy: Secondary | ICD-10-CM | POA: Diagnosis not present

## 2019-06-08 DIAGNOSIS — K279 Peptic ulcer, site unspecified, unspecified as acute or chronic, without hemorrhage or perforation: Secondary | ICD-10-CM | POA: Diagnosis not present

## 2019-06-08 DIAGNOSIS — R109 Unspecified abdominal pain: Secondary | ICD-10-CM | POA: Diagnosis not present

## 2019-06-08 DIAGNOSIS — E039 Hypothyroidism, unspecified: Secondary | ICD-10-CM | POA: Diagnosis not present

## 2019-06-08 DIAGNOSIS — M545 Low back pain, unspecified: Secondary | ICD-10-CM

## 2019-06-08 DIAGNOSIS — G8929 Other chronic pain: Secondary | ICD-10-CM

## 2019-06-08 DIAGNOSIS — M5136 Other intervertebral disc degeneration, lumbar region: Secondary | ICD-10-CM | POA: Diagnosis not present

## 2019-06-08 DIAGNOSIS — Z87891 Personal history of nicotine dependence: Secondary | ICD-10-CM | POA: Diagnosis not present

## 2019-06-08 DIAGNOSIS — R1032 Left lower quadrant pain: Secondary | ICD-10-CM

## 2019-06-08 DIAGNOSIS — R1012 Left upper quadrant pain: Secondary | ICD-10-CM | POA: Diagnosis not present

## 2019-06-08 LAB — COMPREHENSIVE METABOLIC PANEL
ALT: 16 U/L (ref 0–44)
AST: 14 U/L — ABNORMAL LOW (ref 15–41)
Albumin: 3.8 g/dL (ref 3.5–5.0)
Alkaline Phosphatase: 79 U/L (ref 38–126)
Anion gap: 7 (ref 5–15)
BUN: 9 mg/dL (ref 8–23)
CO2: 21 mmol/L — ABNORMAL LOW (ref 22–32)
Calcium: 8.7 mg/dL — ABNORMAL LOW (ref 8.9–10.3)
Chloride: 112 mmol/L — ABNORMAL HIGH (ref 98–111)
Creatinine, Ser: 0.66 mg/dL (ref 0.44–1.00)
GFR calc Af Amer: >60 mL/min (ref >60)
GFR calc non Af Amer: >60 mL/min (ref >60)
Glucose, Bld: 98 mg/dL (ref 70–99)
Potassium: 3.6 mmol/L (ref 3.5–5.1)
Sodium: 140 mmol/L (ref 135–145)
Total Bilirubin: 0.6 mg/dL (ref 0.3–1.2)
Total Protein: 6.5 g/dL (ref 6.5–8.1)

## 2019-06-08 LAB — CBC WITH DIFFERENTIAL/PLATELET
Abs Immature Granulocytes: 0.09 10*3/uL — ABNORMAL HIGH (ref 0.00–0.07)
Basophils Absolute: 0.1 10*3/uL (ref 0.0–0.1)
Basophils Relative: 1 %
Eosinophils Absolute: 0.4 10*3/uL (ref 0.0–0.5)
Eosinophils Relative: 3 %
HCT: 41.4 % (ref 36.0–46.0)
Hemoglobin: 13.4 g/dL (ref 12.0–15.0)
Immature Granulocytes: 1 %
Lymphocytes Relative: 49 %
Lymphs Abs: 6.9 10*3/uL — ABNORMAL HIGH (ref 0.7–4.0)
MCH: 33.3 pg (ref 26.0–34.0)
MCHC: 32.4 g/dL (ref 30.0–36.0)
MCV: 102.7 fL — ABNORMAL HIGH (ref 80.0–100.0)
Monocytes Absolute: 0.8 10*3/uL (ref 0.1–1.0)
Monocytes Relative: 6 %
Neutro Abs: 5.4 10*3/uL (ref 1.7–7.7)
Neutrophils Relative %: 40 %
Platelets: 205 10*3/uL (ref 150–400)
RBC: 4.03 MIL/uL (ref 3.87–5.11)
RDW: 12.1 % (ref 11.5–15.5)
WBC: 13.7 10*3/uL — ABNORMAL HIGH (ref 4.0–10.5)
nRBC: 0 % (ref 0.0–0.2)

## 2019-06-08 LAB — URINALYSIS, ROUTINE W REFLEX MICROSCOPIC
Bilirubin Urine: NEGATIVE
Glucose, UA: NEGATIVE mg/dL
Hgb urine dipstick: NEGATIVE
Ketones, ur: NEGATIVE mg/dL
Leukocytes,Ua: NEGATIVE
Nitrite: NEGATIVE
Protein, ur: NEGATIVE mg/dL
Specific Gravity, Urine: 1.004 — ABNORMAL LOW (ref 1.005–1.030)
pH: 7 (ref 5.0–8.0)

## 2019-06-08 LAB — LIPASE, BLOOD: Lipase: 25 U/L (ref 11–51)

## 2019-06-08 MED ORDER — DICYCLOMINE HCL 10 MG PO CAPS
20.0000 mg | ORAL_CAPSULE | Freq: Once | ORAL | Status: AC
  Administered 2019-06-08: 20 mg via ORAL
  Filled 2019-06-08: qty 2

## 2019-06-08 MED ORDER — CYCLOBENZAPRINE HCL 10 MG PO TABS: 10.0000 mg | ORAL_TABLET | Freq: Two times a day (BID) | ORAL | 0 refills | Status: AC | PRN

## 2019-06-08 MED ORDER — ONDANSETRON HCL 4 MG/2ML IJ SOLN
4.0000 mg | Freq: Once | INTRAMUSCULAR | Status: AC
  Administered 2019-06-08: 4 mg via INTRAVENOUS
  Filled 2019-06-08: qty 2

## 2019-06-08 MED ORDER — MORPHINE SULFATE (PF) 4 MG/ML IV SOLN
4.0000 mg | Freq: Once | INTRAVENOUS | Status: AC
  Administered 2019-06-08: 4 mg via INTRAVENOUS
  Filled 2019-06-08: qty 1

## 2019-06-08 MED ORDER — CYCLOBENZAPRINE HCL 10 MG PO TABS: 10.0000 mg | ORAL_TABLET | Freq: Two times a day (BID) | ORAL | 0 refills | Status: DC | PRN

## 2019-06-08 MED ORDER — SODIUM CHLORIDE (PF) 0.9 % IJ SOLN
INTRAMUSCULAR | Status: AC
  Administered 2019-06-08: 20:00:00
  Filled 2019-06-08: qty 50

## 2019-06-08 MED ORDER — OMEPRAZOLE 20 MG PO CPDR: 20.0000 mg | DELAYED_RELEASE_CAPSULE | Freq: Every day | ORAL | 0 refills | Status: DC

## 2019-06-08 MED ORDER — LIDOCAINE 5 % EX PTCH: 1.0000 | MEDICATED_PATCH | CUTANEOUS | 0 refills | Status: DC

## 2019-06-08 MED ORDER — IOHEXOL 300 MG/ML  SOLN
100.0000 mL | Freq: Once | INTRAMUSCULAR | Status: AC | PRN
  Administered 2019-06-08: 100 mL via INTRAVENOUS

## 2019-06-08 MED ORDER — MORPHINE SULFATE (PF) 2 MG/ML IV SOLN
2.0000 mg | Freq: Once | INTRAVENOUS | Status: AC
  Administered 2019-06-08: 2 mg via INTRAVENOUS
  Filled 2019-06-08: qty 1

## 2019-06-08 NOTE — Discharge Instructions (Addendum)
I have refilled your Flexeril prescription.  Please take as prescribed.  I have also prescribed you Prilosec which is a medication for peptic ulcer disease.  Based on what pain you described to me earlier that you experience when you eat I suspect that you may have a peptic ulcer.   Your CT scan today did not show any signs of kidney stone or infection.  There was a small right-sided kidney cyst that was seen.  There is nothing to do about this today.  This needs to follow-up with primary care doctor.  Return to the emergency department for any worsening pain, fever, difficulty breathing, vomiting or any other worsening or concerning symptoms.

## 2019-06-08 NOTE — ED Provider Notes (Signed)
[facility] DEPT Provider Note   CSN: [id number] Arrival date & time: 06/08/19  1536     History   Chief Complaint Chief Complaint  Patient presents with  . Flank Pain    HPI Diane Bryant is a 63 y.o. female with history of anxiety, hypothyroidism, and profound, chronic lumbar, thoracic and cervical degenerative disc disease     HPI  Patient presents with 2 days of left-sided flank pain that radiates into her left pelvis.  Patient states pain is achy, constant, throbbing, fluctuating in intensity.  Currently 7/10.  Worse with movement, heavy lifting, walking, bending over.  No mitigating factors.  Patient takes daily ibuprofen for her chronic back pain which causes significant radicular pain.  However patient states that she feels that this pain is different.  Patient states she had a similar episode in September which resolved on its own during which time she had 1 episode of hematuria.  She denies any hematuria during this episode which occurred after she was holding her urine for a long car ride and then suddenly had a "burst "sensation.  States that she has several family members who have had kidney stones.  Denies any history of kidney stones herself.  Any vaginal discharge, vaginal pain, dyspareunia, vomiting but does endorse occasional nausea.  States she is use Tylenol and ibuprofen as well as muscle relaxers of which only ibuprofen is somewhat effective.  Denies any dysuria, urgency, frequency.  States that she drinks lots of water.  Denies any decreased appetite. Denies any change in bowel movements, has had vomiting today, denies any blood in her stool, constipation or diarrhea.  Patient also endorses occasional discomfort in the epigastrium after eating food.  States that this happens "every so often ".  Past Medical History:  Diagnosis Date  . Allergy   . Anxiety   . Arthritis   . Benign essential tremor   . CMC arthritis    right  thumb  . GERD (gastroesophageal reflux disease)    OTC meds prn  . Heart murmur    when pregnant-echo in quate-nothing showed up  . Hypotension   . Hypothyroidism   . Low BP   . Neuromuscular disorder ([place])   . Wears glasses     There are no active problems to display for this patient.   Past Surgical History:  Procedure Laterality Date  . CARPOMETACARPEL SUSPENSION PLASTY Right 02/13/2017   Procedure: EXCISION TRAPEZIUM ABDUCTOR POLLICIS LONGUS TRANSFER RIGHT THUMB;  Surgeon: Daryll Brod, MD;  Location: [facility];  Service: Orthopedics;  Laterality: Right;  . EXCISION METACARPAL MASS Right 05/01/2013   Procedure: EXCISIONAL BIOPSY OF RIGHT THUMB;  Surgeon: Cammie Sickle., MD;  Location: [facility];  Service: Orthopedics;  Laterality: Right;  . HERNIA REPAIR  1990   bilat ing  . TUBAL LIGATION       OB History   No obstetric history on file.      Home Medications    Prior to Admission medications   Medication Sig Start Date End Date Taking? Authorizing Provider  ALPRAZolam Duanne Moron) 0.5 MG tablet Take 0.5 mg by mouth at bedtime as needed for sleep.    [provider]  Biotin 10 MG TABS Take by mouth.    [provider]  Coconut Oil 1000 MG CAPS Take by mouth.    [provider]  cyclobenzaprine (FLEXERIL) 10 MG tablet Take 1 tablet (10 mg total) by mouth 2 (  two) times daily as needed for up to 14 days for muscle spasms. 06/08/19 06/22/19  Tedd Sias, PA  EPINEPHrine (EPI-PEN) 0.3 mg/0.3 mL DEVI Inject 0.3 mLs (0.3 mg total) into the muscle once. 12/30/12   Darlyne Russian, MD  glucosamine-chondroitin 500-400 MG tablet Take 1 tablet by mouth 3 (three) times daily.    [provider]  Lutein 20 MG TABS Take by mouth.    [provider]  Omega-3 Fatty Acids (FISH OIL) 1000 MG CAPS Take by mouth.    [provider]  omeprazole (PRILOSEC) 20 MG capsule Take 1 capsule (20 mg total) by mouth  daily for 14 days. 06/08/19 06/22/19  Tedd Sias, PA  PRESCRIPTION MEDICATION Biest Cream - estrogen/testosterone    [provider]  PRESCRIPTION MEDICATION     [provider]  progesterone (PROMETRIUM) 100 MG capsule Take 100 mg by mouth daily.    [provider]  Turmeric 500 MG TABS Take by mouth.    [provider]    Family History History reviewed. No pertinent family history.  Social History Social History   Tobacco Use  . Smoking status: Former Smoker    Quit date: 04/28/1973    Years since quitting: 46.1  . Smokeless tobacco: Never Used  Substance Use Topics  . Alcohol use: No  . Drug use: Yes    Types: Marijuana    Comment: last used 02-06-17     Allergies   Cortisone, Penicillins, and Percocet [oxycodone-acetaminophen]   Review of Systems Review of Systems  Constitutional: Negative for chills and fever.  HENT: Negative for congestion.   Respiratory: Negative for chest tightness.   Cardiovascular: Negative for chest pain.  Gastrointestinal: Positive for abdominal pain. Negative for constipation and diarrhea.  Genitourinary: Negative for decreased urine volume, dyspareunia, dysuria, urgency and vaginal pain.  Musculoskeletal: Positive for back pain.  Neurological: Negative for light-headedness.     Physical Exam Updated Vital Signs BP 124/90   Pulse 77   Temp 98.3 F (36.8 C) (Oral)   Resp 16   SpO2 100%   Physical Exam Vitals signs and nursing note reviewed.  Constitutional:      General: She is not in acute distress. HENT:     Head: Normocephalic and atraumatic.     Nose: Nose normal.  Eyes:     General: No scleral icterus. Neck:     Musculoskeletal: Normal range of motion.  Cardiovascular:     Rate and Rhythm: Normal rate and regular rhythm.     Pulses: Normal pulses.     Heart sounds: Normal heart sounds.  Pulmonary:     Effort: Pulmonary effort is normal. No respiratory distress.     Breath  sounds: No wheezing.  Abdominal:     Palpations: Abdomen is soft.     Tenderness: There is abdominal tenderness (Left lower quadrant). There is left CVA tenderness.  Musculoskeletal:     Right lower leg: No edema.     Left lower leg: No edema.     Comments: Muscular tenderness over the left paralumbar muscles.  Also dense to palpation over the left ribs.  This reproduces the patient's pain  Skin:    General: Skin is warm and dry.     Capillary Refill: Capillary refill takes less than 2 seconds.  Neurological:     Mental Status: She is alert. Mental status is at baseline.  Psychiatric:        Mood and Affect: Mood  normal.        Behavior: Behavior normal.      ED Treatments / Results  Labs (all labs ordered are listed, but only abnormal results are displayed) Labs Reviewed  URINALYSIS, ROUTINE W REFLEX MICROSCOPIC - Abnormal; Notable for the following components:      Result Value   Color, Urine STRAW (*)    Specific Gravity, Urine 1.004 (*)    All other components within normal limits  CBC WITH DIFFERENTIAL/PLATELET - Abnormal; Notable for the following components:   WBC 13.7 (*)    MCV 102.7 (*)    Lymphs Abs 6.9 (*)    Abs Immature Granulocytes 0.09 (*)    All other components within normal limits  COMPREHENSIVE METABOLIC PANEL - Abnormal; Notable for the following components:   Chloride 112 (*)    CO2 21 (*)    Calcium 8.7 (*)    AST 14 (*)    All other components within normal limits  LIPASE, BLOOD    EKG None  Radiology No results found.  Procedures Procedures (including critical care time)  Medications Ordered in ED Medications  ondansetron (ZOFRAN) injection 4 mg (4 mg Intravenous Given 06/08/19 1720)  dicyclomine (BENTYL) capsule 20 mg (20 mg Oral Given 06/08/19 1720)  morphine 4 MG/ML injection 4 mg (4 mg Intravenous Given 06/08/19 1720)     Initial Impression / Assessment and Plan / ED Course  I have reviewed the triage vital signs and the  nursing notes.  Pertinent labs & imaging results that were available during my care of the patient were reviewed by me and considered in my medical decision making (see chart for details).         Patient is 63 year old female presented today with 2 days of left-sided flank pain.  Patient presentation is consistent with musculoskeletal pain and is worse with movement and bending over and heavy lifting.  However she also does have tenderness to palpation left lower quadrant concerning for possible diverticulitis.  Patient also has pain that radiates from her back to her pelvis with left CVA tenderness.  Presentation most concerning for nephrolithiasis, diverticulitis, muscular pain. Doubt AAA as pain is achy and dull in character and patient does not have a history of longstanding untreated hypertension. Doubt appendicitis as there is no decreased appetite, right lower quadrant abdominal pain, fever, migratory pain.  Doubt constipation as patient has had regular bowel movements.  Doubt gastroenteritis this patient denies vomiting or diffuse abdominal pain.  Doubt urinary tract infection as patient has no dysuria urgency or frequency.  Doubt vaginal etiology as patient has no vaginal pain or discharge.  Ordered CMP, lipase, CBC, UA, CT abdomen pelvis with contrast.  If CT is negative for diverticulitis will assume that this is either musculoskeletal or due to kidney stone that is too small to be visualized or unable to visualize with contrast.  We will treat patient empirically for peptic ulcer disease as she has a history of frequent ibuprofen use and describes gnawing epigastric pain that occurs with eating.  7:05 PM patient care signed off to [facility].  Patient will most likely be discharged with Flexeril refill for her musculoskeletal pain which is chronic and Prilosec for empiric treatment of what I suspect is a peptic ulcer.  If CT scan is positive for kidney stone, diverticulitis or  other acute abnormality was delayed and will discern dispo.    Prior CT spine at outside facility     Final Clinical Impressions(s) /  ED Diagnoses   Final diagnoses:  PUD (peptic ulcer disease)  Degenerative disc disease, lumbar  Chronic midline low back pain without sciatica    ED Discharge Orders         Ordered    omeprazole (PRILOSEC) 20 MG capsule  Daily     06/08/19 1856    cyclobenzaprine (FLEXERIL) 10 MG tablet  2 times daily PRN     06/08/19 1856           Tedd Sias, [place] 06/08/19 1905    Sherwood Gambler, MD 06/09/19 1011

## 2019-06-08 NOTE — ED Provider Notes (Signed)
Care assumed from [facility], PA-C at shift change with CT abd/pelvis pending.   In brief, this patient is a 63 y.o. F who presents for evaluation of 2 days of left-sided flank pain that radiates to the left pelvis.  Pain is worse with heavy lifting, walking, bending over.  She does have chronic back pain for which she takes ibuprofen for.  Occasionally, she will get some radicular pain.  She has not had any recent hematuria, urinary complaints.  Please see note from previous history/physical exam.   Physical Exam  BP 124/90   Pulse 77   Temp 98.3 F (36.8 C) (Oral)   Resp 16   SpO2 100%   Physical Exam  Abdomen is soft, nondistended.  Very minimal tenderness.  Patient is talking comfortably with no signs of distress 5 palpating her abdomen.  No rigidity, guarding.  ED Course/Procedures     Procedures   Results for orders placed or performed during the hospital encounter of 06/08/19 (from the past 24 hour(s))  Urinalysis, Routine w reflex microscopic- may I&O cath if menses     Status: Abnormal   Collection Time: 06/08/19  3:55 PM  Result Value Ref Range   Color, Urine STRAW (A) YELLOW   APPearance CLEAR CLEAR   Specific Gravity, Urine 1.004 (L) 1.005 - 1.030   pH 7.0 5.0 - 8.0   Glucose, UA NEGATIVE NEGATIVE mg/dL   Hgb urine dipstick NEGATIVE NEGATIVE   Bilirubin Urine NEGATIVE NEGATIVE   Ketones, ur NEGATIVE NEGATIVE mg/dL   Protein, ur NEGATIVE NEGATIVE mg/dL   Nitrite NEGATIVE NEGATIVE   Leukocytes,Ua NEGATIVE NEGATIVE  CBC with Differential     Status: Abnormal   Collection Time: 06/08/19  5:41 PM  Result Value Ref Range   WBC 13.7 (H) 4.0 - 10.5 K/uL   RBC 4.03 3.87 - 5.11 MIL/uL   Hemoglobin 13.4 12.0 - 15.0 g/dL   HCT 41.4 36.0 - 46.0 %   MCV 102.7 (H) 80.0 - 100.0 fL   MCH 33.3 26.0 - 34.0 pg   MCHC 32.4 30.0 - 36.0 g/dL   RDW 12.1 11.5 - 15.5 %   Platelets 205 150 - 400 K/uL   nRBC 0.0 0.0 - 0.2 %   Neutrophils Relative % 40 %   Neutro Abs 5.4 1.7 -  7.7 K/uL   Lymphocytes Relative 49 %   Lymphs Abs 6.9 (H) 0.7 - 4.0 K/uL   Monocytes Relative 6 %   Monocytes Absolute 0.8 0.1 - 1.0 K/uL   Eosinophils Relative 3 %   Eosinophils Absolute 0.4 0.0 - 0.5 K/uL   Basophils Relative 1 %   Basophils Absolute 0.1 0.0 - 0.1 K/uL   Immature Granulocytes 1 %   Abs Immature Granulocytes 0.09 (H) 0.00 - 0.07 K/uL   Reactive, Benign Lymphocytes PRESENT   Comprehensive metabolic panel     Status: Abnormal   Collection Time: 06/08/19  5:41 PM  Result Value Ref Range   Sodium 140 135 - 145 mmol/L   Potassium 3.6 3.5 - 5.1 mmol/L   Chloride 112 (H) 98 - 111 mmol/L   CO2 21 (L) 22 - 32 mmol/L   Glucose, Bld 98 70 - 99 mg/dL   BUN 9 8 - 23 mg/dL   Creatinine, Ser 0.66 0.44 - 1.00 mg/dL   Calcium 8.7 (L) 8.9 - 10.3 mg/dL   Total Protein 6.5 6.5 - 8.1 g/dL   Albumin 3.8 3.5 - 5.0 g/dL   AST 14 (L) 15 -  41 U/L   ALT 16 0 - 44 U/L   Alkaline Phosphatase 79 38 - 126 U/L   Total Bilirubin 0.6 0.3 - 1.2 mg/dL   GFR calc non Af Amer >60 >60 mL/min   GFR calc Af Amer >60 >60 mL/min   Anion gap 7 5 - 15  Lipase, blood     Status: None   Collection Time: 06/08/19  5:41 PM  Result Value Ref Range   Lipase 25 11 - 51 U/L     MDM   PLAN: Work-up today looks reassuring.  She is pending CT scan to make sure there is no infectious etiology.  If CT scan is unremarkable, can be discharged home.  MDM:  CBC shows slight leukocytosis of 13.7.  Review of her records show that she has had leukocytosis and previous blood work that was done several years ago.  CMP is unremarkable.  Lipase is normal.  UA negative for any infectious etiology.  CT on pelvis reviewed.  There is a 3 mm likely right kidney cyst.  Otherwise unremarkable.  No evidence of kidney stones.  Normal uterus and ovaries.  No acute findings.  Discussed results with patient.  She does report improvement since having pain medication.  She is hemodynamically stable.  On palpation of her abdomen,  she has some mild left lower quadrant abdominal tenderness but states it is much improved.  She is talking without any signs of stress or palpate her abdomen.  We will give her small additional dose of pain medication but feel that she is stable for discharge at this time.  We will send her home with refill of Flexeril.  Patient also will be sent home with Pepcid for PUD.  Patient is agreeable to plan. At this time, patient exhibits no emergent life-threatening condition that require further evaluation in ED or admission. Patient had ample opportunity for questions and discussion. All patient's questions were answered with full understanding. Strict return precautions discussed. Patient expresses understanding and agreement to plan.   Portions of this note were generated with [employer]. Dictation errors may occur despite best attempts at proofreading.    1. PUD (peptic ulcer disease)   2. Degenerative disc disease, lumbar   3. Chronic midline low back pain without sciatica   4. Left lower quadrant abdominal pain   5. Flank pain   6. Renal cyst      Desma Mcgregor 06/08/19 [id number]    Sherwood Gambler, MD 06/09/19 1011

## 2019-06-08 NOTE — ED Triage Notes (Signed)
Pt c/o left sided flank pain . Pt states she has had burning from her flank wrapping around to her groin. Pt states that the pain felt like it burst, and then restarted. Pt states no relief with muscle relaxers. Pt states the most relief with advil.

## 2019-06-10 DIAGNOSIS — M4120 Other idiopathic scoliosis, site unspecified: Secondary | ICD-10-CM | POA: Diagnosis not present

## 2019-06-10 DIAGNOSIS — M419 Scoliosis, unspecified: Secondary | ICD-10-CM | POA: Diagnosis not present

## 2019-06-10 DIAGNOSIS — M4716 Other spondylosis with myelopathy, lumbar region: Secondary | ICD-10-CM | POA: Diagnosis not present

## 2019-06-10 DIAGNOSIS — M545 Low back pain: Secondary | ICD-10-CM | POA: Diagnosis not present

## 2019-06-10 DIAGNOSIS — M5106 Intervertebral disc disorders with myelopathy, lumbar region: Secondary | ICD-10-CM | POA: Diagnosis not present

## 2019-06-23 DIAGNOSIS — F4312 Post-traumatic stress disorder, chronic: Secondary | ICD-10-CM | POA: Diagnosis not present

## 2019-07-08 DIAGNOSIS — F4312 Post-traumatic stress disorder, chronic: Secondary | ICD-10-CM | POA: Diagnosis not present

## 2019-08-08 DIAGNOSIS — F4312 Post-traumatic stress disorder, chronic: Secondary | ICD-10-CM | POA: Diagnosis not present

## 2019-09-03 DIAGNOSIS — F4312 Post-traumatic stress disorder, chronic: Secondary | ICD-10-CM | POA: Diagnosis not present

## 2019-09-17 DIAGNOSIS — F4312 Post-traumatic stress disorder, chronic: Secondary | ICD-10-CM | POA: Diagnosis not present

## 2019-10-10 DIAGNOSIS — F4312 Post-traumatic stress disorder, chronic: Secondary | ICD-10-CM | POA: Diagnosis not present

## 2019-10-24 DIAGNOSIS — M4120 Other idiopathic scoliosis, site unspecified: Secondary | ICD-10-CM | POA: Diagnosis not present

## 2019-10-24 DIAGNOSIS — M4716 Other spondylosis with myelopathy, lumbar region: Secondary | ICD-10-CM | POA: Diagnosis not present

## 2019-10-24 DIAGNOSIS — M5106 Intervertebral disc disorders with myelopathy, lumbar region: Secondary | ICD-10-CM | POA: Diagnosis not present

## 2019-10-30 DIAGNOSIS — F4312 Post-traumatic stress disorder, chronic: Secondary | ICD-10-CM | POA: Diagnosis not present

## 2019-11-04 ENCOUNTER — Encounter: Payer: Self-pay | Admitting: Family Medicine

## 2019-11-06 DIAGNOSIS — T50Z95A Adverse effect of other vaccines and biological substances, initial encounter: Secondary | ICD-10-CM | POA: Diagnosis not present

## 2019-11-06 DIAGNOSIS — G250 Essential tremor: Secondary | ICD-10-CM | POA: Diagnosis not present

## 2019-11-06 DIAGNOSIS — R110 Nausea: Secondary | ICD-10-CM | POA: Diagnosis not present

## 2019-11-06 DIAGNOSIS — F4310 Post-traumatic stress disorder, unspecified: Secondary | ICD-10-CM | POA: Diagnosis not present

## 2019-11-19 ENCOUNTER — Encounter: Payer: Self-pay | Admitting: Gastroenterology

## 2019-11-24 DIAGNOSIS — F4312 Post-traumatic stress disorder, chronic: Secondary | ICD-10-CM | POA: Diagnosis not present

## 2019-12-12 DIAGNOSIS — Z419 Encounter for procedure for purposes other than remedying health state, unspecified: Secondary | ICD-10-CM | POA: Diagnosis not present

## 2019-12-12 DIAGNOSIS — L814 Other melanin hyperpigmentation: Secondary | ICD-10-CM | POA: Diagnosis not present

## 2019-12-12 DIAGNOSIS — L570 Actinic keratosis: Secondary | ICD-10-CM | POA: Diagnosis not present

## 2019-12-12 DIAGNOSIS — D2271 Melanocytic nevi of right lower limb, including hip: Secondary | ICD-10-CM | POA: Diagnosis not present

## 2019-12-19 ENCOUNTER — Encounter: Payer: Self-pay | Admitting: Gastroenterology

## 2019-12-19 ENCOUNTER — Ambulatory Visit (INDEPENDENT_AMBULATORY_CARE_PROVIDER_SITE_OTHER): Payer: BC Managed Care – PPO | Admitting: Gastroenterology

## 2019-12-19 VITALS — BP 118/86 | HR 105 | Ht 65.0 in | Wt 168.0 lb

## 2019-12-19 DIAGNOSIS — R1013 Epigastric pain: Secondary | ICD-10-CM

## 2019-12-19 NOTE — Progress Notes (Signed)
Referring Provider: Roetta Sessions* Primary Care Physician:  Roetta Sessions, NP  Reason for Consultation: Upper abdominal pain   IMPRESSION:  Epigastric pain and reflux  Recent abnormal home testing for food allergy testing Hiatal hernia seen on recent CT scan Last stool study test for colon cancer over 5 years ago No prior endoscopic evaluation No family history of first-degree relatives with GI malignancy  Epigastric pain and reflux may all be related to reflux. Other etiologies including functional dyspepsia, gastritis, H pylori, and eosinophilic esophagitis. Malignancy less likely.  Recent labs and CT scan from ER visit are reassuring. EGD with esophageal, gastric and duodenal biopsies recommended. Minimizing medication exposure due to her concerns about safety.   Patient concerned about food allergies although symptoms may be more consistent with intolerance without IgE mediated symptoms. I reviewed her recent test results but find them difficult to interpret due to the high false positive and false negative rates. Given her interest in pursing additional testing and understanding her results, I offered referral to an allergist.  It may be more worthwhile to trial dairy, then gluten, and then low FODMAP diets first.  PLAN: Referral to Allergist to review food allergy concerns Lifestyle modifications for reflux to allow for symptom control without medications EGD with esophageal, gastric, and duodenal biopsies Colonoscopy for colon cancer screening at her convenience  Please see the "Patient Instructions" section for addition details about the plan.  HPI: Diane Bryant is a 64 y.o. female referred by NP Domingo Cocking with [facility].  The history is obtained through the patient, review of referral records provided by NP Domingo Cocking, and review of records that the patient brings to this appointment.  She has anxiety treated with alprazolam, arthritis,  chronic back pain, PTSD, benign essential tremor, a distant history of pneumonia, and a urinary tract infection 25 years ago.  She received her Covid 19 vaccine in March.  Lives in [place] part time. Followed by an acupuncturist to avoid medications.   In her 72s she had a severe emotion situation. In response she stopped eating. Requiring hospitalization with IV nutrition. She wonders if she might have a nervous stomach.   In 2009-2010 she developed a "severe reaction" to wine with whole body shaking. Interance to alcohol since that time Now also intolerant to vinegar. Looking online and she is concerned that she might have a tannin allergy.  Previously evaluated by GI 5+ years ago and was prescribed pantoprazole. Can't remember the doctor or the office location.   Reports upper digestion issues from her throat to her stomach.  Epigastric burning and acid reflux. Worried that her sinuses drain into her esophagus with an associated cough  Wakes up between 3:30-6:30 to have a bowel movement. Has 3-5 BM daily. No blood or mucous.  Developed nausea following the [employer] Covid vaccine in March.  Seen in ED 05/2019 and was told she had an ulcer.  I have personally reviewed the CT of the abdomen and pelvis with contrast obtained at that time that shows a small hiatal hernia. Treated with prilosec.  Labs obtained during that evaluation included a normal CMP except for a calcium of 8.7, chloride 112, bicarb 21.  Her white count was elevated at 13.7.  Hemoglobin was 13.4, MCV 102.7, RDW 12.1, platelets 205.  Urinalysis was normal.  She supplies copies of labs that show a normal TSH.  She brings copies of: - CT Cervical Spine from 2018 that  showed chronic bilateral pars defects at  L5 associated, trace 2 mm spondylolisthesis. Broad right extraforaminal disc protrusion.  - Home food allergy testing that shows high reactivity to winter squash; moderate reactivity to aldmonds, bell pepper, blueberry, cahsew cow's  milk, pineapple; mild reactivity with apple,asparagus, bana, chicken, clam, coconut, egg yolk, eggplant, garlic, ginger, gluten, grape, green pea, kale, mozarella, peanut, rye, wheat, and yogurt.   She noticed allergies prior to that time and had already started to avoid some of these foods.   Prior colon cancer screening includes a fecal occult blood test between 3-10 years ago.  No prior endoscopy.   Reports sensitivities to multiple medications and had p450 testing performed in the past.   No known family history of colon cancer or polyps. No family history of uterine/endometrial cancer, pancreatic cancer or gastric/stomach cancer.   Past Medical History:  Diagnosis Date  . Allergy   . Anxiety   . Arthritis   . Benign essential tremor   . CMC arthritis    right thumb  . Depression   . GERD (gastroesophageal reflux disease)    OTC meds prn  . Heart murmur    when pregnant-echo in quate-nothing showed up  . History of pneumonia   . Hypotension   . Hypothyroidism   . Low BP   . Neuromuscular disorder ([place])   . PUD (peptic ulcer disease)   . Wears glasses     Past Surgical History:  Procedure Laterality Date  . CARPOMETACARPEL SUSPENSION PLASTY Right 02/13/2017   Procedure: EXCISION TRAPEZIUM ABDUCTOR POLLICIS LONGUS TRANSFER RIGHT THUMB;  Surgeon: Daryll Brod, MD;  Location: [facility];  Service: Orthopedics;  Laterality: Right;  . EXCISION METACARPAL MASS Right 05/01/2013   Procedure: EXCISIONAL BIOPSY OF RIGHT THUMB;  Surgeon: Cammie Sickle., MD;  Location: [facility];  Service: Orthopedics;  Laterality: Right;  . HERNIA REPAIR  1990   bilat ing  . TUBAL LIGATION      Current Outpatient Medications  Medication Sig Dispense Refill  . ALPRAZolam (XANAX) 0.5 MG tablet Take 0.5 mg by mouth at bedtime as needed for sleep.    . Biotin 10 MG TABS Take by mouth.    Valente David Yeast (YEAST EXTRACT PO) Take 1 tablet by mouth daily.    .  calcium-vitamin D (OSCAL 500/200 D-3) 500-200 MG-UNIT tablet Take 1,250 mg by mouth 2 (two) times daily.    . Coconut Oil 1000 MG CAPS Take by mouth.    . diclofenac Sodium (VOLTAREN) 1 % GEL Apply 1 application topically daily as needed (back pain).     Marland Kitchen EPINEPHrine (EPI-PEN) 0.3 mg/0.3 mL DEVI Inject 0.3 mLs (0.3 mg total) into the muscle once. 1 Device 11  . Estradiol-Estriol-Progesterone (BIEST/PROGESTERONE TD) Apply 1 application topically daily.    Marland Kitchen glucosamine-chondroitin 500-400 MG tablet Take 1 tablet by mouth 3 (three) times daily.    Marland Kitchen ibuprofen (ADVIL) 400 MG tablet Take 400 mg by mouth every 6 (six) hours as needed for mild pain or moderate pain.    Marland Kitchen lidocaine (LIDODERM) 5 % Place 1 patch onto the skin daily. Remove & Discard patch within 12 hours or as directed by MD 10 patch 0  . Lutein 20 MG TABS Take by mouth.    . Omega-3 Fatty Acids (FISH OIL) 1000 MG CAPS Take by mouth.    . ondansetron (ZOFRAN-ODT) 4 MG disintegrating tablet Take 4 mg by mouth every 8 (eight) hours as needed.    Marland Kitchen PRESCRIPTION MEDICATION     .  progesterone (PROMETRIUM) 100 MG capsule Take 100 mg by mouth daily.    Marland Kitchen UNABLE TO FIND Take 1 capsule by mouth daily. Med Name:CBD- for upset stomach    . Zinc Sulfate (ZINC 15) 66 MG TABS Take 1 tablet by mouth daily.     No current facility-administered medications for this visit.    Allergies as of 12/19/2019 - Review Complete 12/19/2019  Allergen Reaction Noted  . Cortisone  02/07/2017  . Gabapentin Anxiety 06/08/2019  . Other Diarrhea and Nausea And Vomiting 12/19/2019  . Penicillins Swelling 12/13/2012  . Percocet [oxycodone-acetaminophen] Other (See Comments) 02/07/2017    No family history on file.  Social History   Socioeconomic History  . Marital status: Married    Spouse name: Not on file  . Number of children: 4  . Years of education: Not on file  . Highest education level: Not on file  Occupational History  . Occupation: retired    Tobacco Use  . Smoking status: Former Smoker    Quit date: 04/28/1973    Years since quitting: 46.6  . Smokeless tobacco: Never Used  Substance and Sexual Activity  . Alcohol use: No  . Drug use: Yes    Types: Marijuana    Comment: last used 02-06-17  . Sexual activity: Yes  Other Topics Concern  . Not on file  Social History Narrative  . Not on file   Social Determinants of Health   Financial Resource Strain:   . Difficulty of Paying Living Expenses:   Food Insecurity:   . Worried About Charity fundraiser in the Last Year:   . Arboriculturist in the Last Year:   Transportation Needs:   . Film/video editor (Medical):   Marland Kitchen Lack of Transportation (Non-Medical):   Physical Activity:   . Days of Exercise per Week:   . Minutes of Exercise per Session:   Stress:   . Feeling of Stress :   Social Connections:   . Frequency of Communication with Friends and Family:   . Frequency of Social Gatherings with Friends and Family:   . Attends Religious Services:   . Active Member of Clubs or Organizations:   . Attends Archivist Meetings:   Marland Kitchen Marital Status:   Intimate Partner Violence:   . Fear of Current or Ex-Partner:   . Emotionally Abused:   Marland Kitchen Physically Abused:   . Sexually Abused:     Review of Systems: 12 system ROS is negative except as noted above with the addition of allergies, anxiety, arthritis, back pain, depression, fatigue, insomnia, urine leakage.   Physical Exam: General:   Alert,  well-nourished, pleasant and cooperative in NAD Head:  Normocephalic and atraumatic. Eyes:  Sclera clear, no icterus.   Conjunctiva pink. Ears:  Normal auditory acuity. Nose:  No deformity, discharge,  or lesions. Mouth:  No deformity or lesions.   Neck:  Supple; no masses or thyromegaly. Lungs:  Clear throughout to auscultation.   No wheezes. Heart:  Regular rate and rhythm; no murmurs. Abdomen:  Soft, nontender, nondistended, normal bowel sounds, no rebound or  guarding. No hepatosplenomegaly.   Rectal:  Deferred  Msk:  Symmetrical. No boney deformities LAD: No inguinal or umbilical LAD Extremities:  No clubbing or edema. Neurologic:  Alert and  oriented x4;  grossly nonfocal Skin:  Intact without significant lesions or rashes. Palmar erythema present. No spider angiomas.  Psych:  Alert and cooperative. Normal mood and affect.    Joelene Millin  Jovita Kussmaul, MD, MPH 12/19/2019, 2:28 PM

## 2019-12-19 NOTE — Patient Instructions (Addendum)
I have recommended an upper endoscopy for further evaluation of your symptoms.  I will refer you to an allergist to discuss your concerns about food allergy testing. [facility] will contact you with an appointment date and time. If you do not hear from them then call their office at [phone].  Modifying diet and lifestyle remains the foundation for treating the symptoms of reflux.   The following strategies help you prevent heartburn and other symptoms by avoiding foods that reduce the effectiveness of the bottom of the esophagus from protecting the esophagus from from acid injury and keeping stomach contents where they belong.  Eat smaller meals. A large meal remains in the stomach for several hours, increasing the chances for gastroesophageal reflux. Try distributing your daily food intake over three, four, or five smaller meals.  Relax when you eat. Stress increases the production of stomach acid, so make meals a pleasant, relaxing experience. Sit down. Eat slowly. Chew completely. Play soothing music.  Relax between meals. Relaxation therapies such as deep breathing, meditation, massage, tai chi, or yoga may help prevent and relieve heartburn.   Remain upright after eating. You should maintain postures that reduce the risk for reflux for at least three hours after eating. For example, dont bend over or strain to lift heavy objects.  Avoid eating within three hours of going to bed. Lying down after eating will increase chances of reflux.  Lose weight. Excess pounds increase pressure on the stomach and can push acid into the esophagus.  Loosen up. Avoid tight belts, waistbands, and other clothing that puts pressure on your stomach.  Avoid foods that burn. Abstain from food or drink that increases gastric acid secretion, decreases the valve at the bottom of the esophagus, or slows the emptying of the stomach. Known offenders include high-fat foods, spicy dishes,  tomatoes and tomato products, citrus fruits, garlic, onions, milk, carbonated drinks, coffee (including decaf), tea, chocolate, mints, and alcohol.  Stop smoking. Nicotine stimulates stomach acid.  Avoid medications that can predispose you to reflux including aspirin and other NSAIDs, oral contraceptives, hormone therapy drugs, and certain antidepressants.  Sleep at an angle. If youre bothered by nighttime heartburn, place a wedge (available in medical supply stores or a wedge pillow through [employer]) under your upper body. But dont elevate your head with extra pillows. That makes reflux worse by bending you at the waist and compressing your stomach. You might also try sleeping on your left side, as studies have shown this can help--perhaps because the stomach is on the left side of the body, so lying on your left positions most of the stomach below the bottom of the esophagus.

## 2019-12-26 ENCOUNTER — Encounter: Payer: Self-pay | Admitting: Gastroenterology

## 2019-12-31 ENCOUNTER — Encounter: Payer: Self-pay | Admitting: Certified Registered Nurse Anesthetist

## 2020-01-01 ENCOUNTER — Encounter: Payer: Self-pay | Admitting: Gastroenterology

## 2020-01-01 ENCOUNTER — Ambulatory Visit (AMBULATORY_SURGERY_CENTER): Payer: BC Managed Care – PPO | Admitting: Gastroenterology

## 2020-01-01 ENCOUNTER — Other Ambulatory Visit: Payer: Self-pay

## 2020-01-01 VITALS — BP 106/63 | HR 61 | Temp 97.1°F | Resp 17 | Ht 65.0 in | Wt 168.0 lb

## 2020-01-01 DIAGNOSIS — R1013 Epigastric pain: Secondary | ICD-10-CM | POA: Diagnosis not present

## 2020-01-01 DIAGNOSIS — K2970 Gastritis, unspecified, without bleeding: Secondary | ICD-10-CM

## 2020-01-01 DIAGNOSIS — G8929 Other chronic pain: Secondary | ICD-10-CM

## 2020-01-01 DIAGNOSIS — K449 Diaphragmatic hernia without obstruction or gangrene: Secondary | ICD-10-CM

## 2020-01-01 DIAGNOSIS — K228 Other specified diseases of esophagus: Secondary | ICD-10-CM | POA: Diagnosis not present

## 2020-01-01 DIAGNOSIS — K2950 Unspecified chronic gastritis without bleeding: Secondary | ICD-10-CM | POA: Diagnosis not present

## 2020-01-01 DIAGNOSIS — B9681 Helicobacter pylori [H. pylori] as the cause of diseases classified elsewhere: Secondary | ICD-10-CM | POA: Diagnosis not present

## 2020-01-01 DIAGNOSIS — K219 Gastro-esophageal reflux disease without esophagitis: Secondary | ICD-10-CM

## 2020-01-01 MED ORDER — SODIUM CHLORIDE 0.9 % IV SOLN: 500.0000 mL | INTRAVENOUS | Status: DC

## 2020-01-01 NOTE — Op Note (Signed)
[facility] Patient Name: Diane Bryant Procedure Date: 01/01/2020 8:34 AM MRN: [id number] Endoscopist: Thornton Park MD, MD Age: 64 Referring MD:  Date of Birth: [birth year] Gender: Female Account #: [id number] Procedure:                Upper GI endoscopy Indications:              Epigastric abdominal pain, Suspected esophageal                            reflux Medicines:                Monitored Anesthesia Care Procedure:                Pre-Anesthesia Assessment:                           - Prior to the procedure, a History and Physical                            was performed, and patient medications and                            allergies were reviewed. The patient's tolerance of                            previous anesthesia was also reviewed. The risks                            and benefits of the procedure and the sedation                            options and risks were discussed with the patient.                            All questions were answered, and informed consent                            was obtained. Prior Anticoagulants: The patient has                            taken no previous anticoagulant or antiplatelet                            agents. ASA Grade Assessment: II - A patient with                            mild systemic disease. After reviewing the risks                            and benefits, the patient was deemed in                            satisfactory condition to undergo the procedure.  After obtaining informed consent, the endoscope was                            passed under direct vision. Throughout the                            procedure, the patient's blood pressure, pulse, and                            oxygen saturations were monitored continuously. The                            Endoscope was introduced through the mouth, and                            advanced to the third part of duodenum. The  upper                            GI endoscopy was accomplished without difficulty.                            The patient tolerated the procedure well. Scope In: Scope Out: Findings:                 The examined esophagus was normal. Biopsies were                            obtained from the proximal and distal esophagus                            with cold forceps for histology.                           A medium-sized hiatal hernia (Grade II [place]) was                            present. No Cameron's ulcers seen.                           The entire examined stomach was normal. Biopsies                            were taken from the antrum, body, and fundus with a                            cold forceps for histology. Estimated blood loss                            was minimal.                           The examined duodenum was normal. Biopsies were                            taken with a  cold forceps for histology. Estimated                            blood loss was minimal.                           The exam was otherwise without abnormality. Complications:            No immediate complications. Estimated blood loss:                            Minimal. Estimated Blood Loss:     Estimated blood loss was minimal. Impression:               - Normal esophagus. Biopsied.                           - Medium-sized hiatal hernia.                           - Normal stomach. Biopsied.                           - Normal examined duodenum. Biopsied.                           - The examination was otherwise normal. Recommendation:           - Patient has a contact number available for                            emergencies. The signs and symptoms of potential                            delayed complications were discussed with the                            patient. Return to normal activities tomorrow.                            Written discharge instructions were provided to the                             patient.                           - Resume previous diet.                           - Continue present medications.                           - Await pathology results. Thornton Park MD, MD 01/01/2020 8:58:23 AM This report has been signed electronically.

## 2020-01-01 NOTE — Patient Instructions (Signed)
Handout on gastritis   YOU HAD AN ENDOSCOPIC PROCEDURE TODAY AT [facility]:   Refer to the procedure report that was given to you for any specific questions about what was found during the examination.  If the procedure report does not answer your questions, please call your gastroenterologist to clarify.  If you requested that your care partner not be given the details of your procedure findings, then the procedure report has been included in a sealed envelope for you to review at your convenience later.  YOU SHOULD EXPECT: Some feelings of bloating in the abdomen. Passage of more gas than usual.  Walking can help get rid of the air that was put into your GI tract during the procedure and reduce the bloating. If you had a lower endoscopy (such as a colonoscopy or flexible sigmoidoscopy) you may notice spotting of blood in your stool or on the toilet paper. If you underwent a bowel prep for your procedure, you may not have a normal bowel movement for a few days.  Please Note:  You might notice some irritation and congestion in your nose or some drainage.  This is from the oxygen used during your procedure.  There is no need for concern and it should clear up in a day or so.  SYMPTOMS TO REPORT IMMEDIATELY:    Following upper endoscopy (EGD)  Vomiting of blood or coffee ground material  New chest pain or pain under the shoulder blades  Painful or persistently difficult swallowing  New shortness of breath  Fever of 100F or higher  Black, tarry-looking stools  For urgent or emergent issues, a gastroenterologist can be reached at any hour by calling [phone]. Do not use MyChart messaging for urgent concerns.    DIET:  We do recommend a small meal at first, but then you may proceed to your regular diet.  Drink plenty of fluids but you should avoid alcoholic beverages for 24 hours.  ACTIVITY:  You should plan to take it easy for the rest of today and you should NOT DRIVE  or use heavy machinery until tomorrow (because of the sedation medicines used during the test).    FOLLOW UP: Our staff will call the number listed on your records 48-72 hours following your procedure to check on you and address any questions or concerns that you may have regarding the information given to you following your procedure. If we do not reach you, we will leave a message.  We will attempt to reach you two times.  During this call, we will ask if you have developed any symptoms of COVID 19. If you develop any symptoms (ie: fever, flu-like symptoms, shortness of breath, cough etc.) before then, please call [phone].  If you test positive for Covid 19 in the 2 weeks post procedure, please call and report this information to [place].    If any biopsies were taken you will be contacted by phone or by letter within the next 1-3 weeks.  Please call us at [phone] if you have not heard about the biopsies in 3 weeks.    SIGNATURES/CONFIDENTIALITY: You and/or your care partner have signed paperwork which will be entered into your electronic medical record.  These signatures attest to the fact that that the information above on your After Visit Summary has been reviewed and is understood.  Full responsibility of the confidentiality of this discharge information lies with you and/or your care-partner.

## 2020-01-01 NOTE — Progress Notes (Signed)
Report given to PACU, vss 

## 2020-01-01 NOTE — Progress Notes (Signed)
Vs CW ° °

## 2020-01-05 ENCOUNTER — Telehealth: Payer: Self-pay | Admitting: *Deleted

## 2020-01-05 ENCOUNTER — Telehealth: Payer: Self-pay

## 2020-01-05 NOTE — Telephone Encounter (Signed)
2nd follow up call made.  NALM 

## 2020-01-05 NOTE — Telephone Encounter (Signed)
Attempted f/u phone call. No answer. Left message. °

## 2020-01-07 ENCOUNTER — Other Ambulatory Visit: Payer: Self-pay

## 2020-01-07 DIAGNOSIS — R1013 Epigastric pain: Secondary | ICD-10-CM

## 2020-01-07 MED ORDER — METRONIDAZOLE 500 MG PO TABS: 500.0000 mg | ORAL_TABLET | Freq: Four times a day (QID) | ORAL | 0 refills | Status: DC

## 2020-01-07 MED ORDER — TETRACYCLINE HCL 500 MG PO CAPS: 500.0000 mg | ORAL_CAPSULE | Freq: Four times a day (QID) | ORAL | 0 refills | Status: DC

## 2020-01-07 MED ORDER — BISMUTH SUBSALICYLATE 262 MG PO TABS: 262.0000 mg | ORAL_TABLET | Freq: Four times a day (QID) | ORAL | 0 refills | Status: DC

## 2020-01-07 MED ORDER — PANTOPRAZOLE SODIUM 40 MG PO TBEC: 40.0000 mg | DELAYED_RELEASE_TABLET | Freq: Two times a day (BID) | ORAL | 0 refills | Status: DC

## 2020-01-12 ENCOUNTER — Telehealth: Payer: Self-pay | Admitting: Gastroenterology

## 2020-01-12 NOTE — Telephone Encounter (Signed)
[facility] pt that was diagnosed with H pylori. She was prescribed bismuth, protonix, Flagyl and tetracycline. States the tetracycline is making her nauseated and she is requesting something different be sent in for her. Dr. Lyndel Safe as DOD please advise.

## 2020-01-12 NOTE — Telephone Encounter (Signed)
DOD  Patient is allergic to penicillin  Having severe nausea due to tetracycline (for HP treatment)  Switch tetracycline to Biaxin 500 mg p.o. twice daily x 10 days   Rest of the orders remain the same  RG

## 2020-01-13 ENCOUNTER — Other Ambulatory Visit: Payer: Self-pay

## 2020-01-13 MED ORDER — CLARITHROMYCIN 500 MG PO TABS: 500.0000 mg | ORAL_TABLET | Freq: Two times a day (BID) | ORAL | 0 refills | Status: DC

## 2020-01-13 NOTE — Telephone Encounter (Signed)
Script sent to pharmacy, pt notified via mychart.Marland Kitchen

## 2020-01-13 NOTE — Telephone Encounter (Signed)
Pt called does not use my Chart please call pt she is wanting to know if she continues with other medication while taking  Biaxin. Please advise

## 2020-01-13 NOTE — Telephone Encounter (Signed)
Spoke with pt and she is aware.

## 2020-01-14 ENCOUNTER — Telehealth: Payer: Self-pay | Admitting: Gastroenterology

## 2020-01-14 DIAGNOSIS — A048 Other specified bacterial intestinal infections: Secondary | ICD-10-CM

## 2020-01-14 NOTE — Telephone Encounter (Signed)
Patient called states she is having trouble with the medications. She feels pain on her chest and also felt like something moved this morning in that area she is very scared and is seeking advise. Also states she does not use MyChart

## 2020-01-14 NOTE — Telephone Encounter (Signed)
She does sound sensitive to medications. I do not think reducing the dosing and treating for longer has been proven to be effective in H pylori. It may be best to have her see ID for their input about antibiotic options in this setting. Please see if they would see her as an outpatient. In the meantime, it may be best to stop all of the medications except for the PPI. Thank you.

## 2020-01-14 NOTE — Telephone Encounter (Signed)
Pt called yesterday stating she could not tolerate tetracycline. Dr. Lyndel Safe started her on biaxcin instead along with the other meds. Pt states she had terrible gas under her breast bone, she had bad reflux pain and when she breathed through her mouth she heard a sound she had never heard before. States she is sensitive to meds and has been told in the past that she should take 1/2 dose of antibiotic for longer period of time, or that she tolerates sulfa drugs. Please advise.

## 2020-01-14 NOTE — Telephone Encounter (Signed)
Spoke with pt and she is aware, she knows to expect a phone call from ID regarding appt. Referral entered in epic.

## 2020-01-29 ENCOUNTER — Ambulatory Visit (INDEPENDENT_AMBULATORY_CARE_PROVIDER_SITE_OTHER): Payer: BC Managed Care – PPO | Admitting: Internal Medicine

## 2020-01-29 ENCOUNTER — Other Ambulatory Visit: Payer: Self-pay

## 2020-01-29 ENCOUNTER — Telehealth: Payer: Self-pay

## 2020-01-29 DIAGNOSIS — A048 Other specified bacterial intestinal infections: Secondary | ICD-10-CM

## 2020-01-29 DIAGNOSIS — F419 Anxiety disorder, unspecified: Secondary | ICD-10-CM | POA: Diagnosis not present

## 2020-01-29 DIAGNOSIS — K2100 Gastro-esophageal reflux disease with esophagitis, without bleeding: Secondary | ICD-10-CM

## 2020-01-29 MED ORDER — DOXYCYCLINE HYCLATE 100 MG PO TABS: 100.0000 mg | ORAL_TABLET | Freq: Two times a day (BID) | ORAL | 0 refills | Status: DC

## 2020-01-29 NOTE — Telephone Encounter (Signed)
Received call from [facility] regarding prescription for Doxycyline. States they are unable to fill medication due to not being in stock.  Called patient to update medication will be sent to [facility]. Patient verbalized understanding. [place]

## 2020-01-29 NOTE — Progress Notes (Signed)
[facility] for Infectious Disease  Reason for Consult: Helicobacter pylori infection Referring Provider: Dr. Joelene Millin [facility]  Assessment: She has struggled with acid reflux for many years and is motivated to successfully treat her underlying Helicobacter infection.  I reviewed options with her which are somewhat limited by her penicillin tolerance and problems tolerating tetracycline and clarithromycin.  I will switch the tetracycline to doxycycline taken with food.  I instructed her to take ondansetron 30 minutes before her doxycycline.  Plan: 1. Try bismuth, Protonix, metronidazole and doxycycline supplemented by ondansetron for 2 weeks 2. Follow-up here for stool antigen testing in 6 weeks  Patient Active Problem List   Diagnosis Date Noted  . Helicobacter pylori (H. pylori) infection 01/29/2020    Priority: High  . Reflux esophagitis 01/29/2020  . Anxiety 01/29/2020    Patient's Medications  New Prescriptions   DOXYCYCLINE (VIBRA-TABS) 100 MG TABLET    Take 1 tablet (100 mg total) by mouth 2 (two) times daily.  Previous Medications   ALPRAZOLAM (XANAX) 0.5 MG TABLET    Take 0.5 mg by mouth at bedtime as needed for sleep.   BIOTIN 10 MG TABS    Take by mouth.   BISMUTH SUBSALICYLATE 503 MG TABS    Take 1 tablet (262 mg total) by mouth in the morning, at noon, in the evening, and at bedtime.   BREWERS YEAST (YEAST EXTRACT PO)    Take 1 tablet by mouth daily.   CALCIUM-VITAMIN D (OSCAL 500/200 D-3) 500-200 MG-UNIT TABLET    Take 1,250 mg by mouth 2 (two) times daily.   COCONUT OIL 1000 MG CAPS    Take by mouth.   DICLOFENAC SODIUM (VOLTAREN) 1 % GEL    Apply 1 application topically daily as needed (back pain).    ESTRADIOL-ESTRIOL-PROGESTERONE (BIEST/PROGESTERONE TD)    Apply 1 application topically daily.   GLUCOSAMINE-CHONDROITIN 500-400 MG TABLET    Take 1 tablet by mouth 3 (three) times daily.   LUTEIN 20 MG TABS    Take by mouth.   METRONIDAZOLE (FLAGYL) 500  MG TABLET    Take 1 tablet (500 mg total) by mouth 4 (four) times daily.   OMEGA-3 FATTY ACIDS (FISH OIL) 1000 MG CAPS    Take by mouth.   ONDANSETRON (ZOFRAN-ODT) 4 MG DISINTEGRATING TABLET    Take 4 mg by mouth every 8 (eight) hours as needed.   PANTOPRAZOLE (PROTONIX) 40 MG TABLET    Take 1 tablet (40 mg total) by mouth 2 (two) times daily.   PRESCRIPTION MEDICATION       PROGESTERONE (PROMETRIUM) 100 MG CAPSULE    Take 100 mg by mouth daily.   UNABLE TO FIND    Take 1 capsule by mouth daily. Med Name:CBD- for upset stomach   ZINC SULFATE (ZINC 15) 66 MG TABS    Take 1 tablet by mouth daily.  Modified Medications   No medications on file  Discontinued Medications   CLARITHROMYCIN (BIAXIN) 500 MG TABLET    Take 1 tablet (500 mg total) by mouth 2 (two) times daily.   TETRACYCLINE (SUMYCIN) 500 MG CAPSULE    Take 1 capsule (500 mg total) by mouth 4 (four) times daily.    HPI: Diane Bryant is a 64 y.o. female with a long history of acid reflux dating back to 2005.  She has never been on chronic proton pump inhibitors.  She has lots of sensitivities to various medications and food.  She  says that she is intolerant of alcohol because of upset stomach and also has intermittent difficulties with strawberries, tomatoes, vinegar, and salads.  She says that she often avoids going out to eat with family and friends because she knows she may have trouble with nausea and indigestion.  She was seen in the emergency department last November with left flank pain.  CT scan showed a hiatal hernia.  She says that she was told that she had peptic ulcer disease and was given Pepcid which she took for 2 weeks.  She says that that helped but she did not continue taking it.  She occasionally will take Tums.  She underwent upper endoscopy last month.  There were no visible abnormalities other than her hiatal hernia.  Esophageal biopsy showed evidence of reflux esophagitis.  Gastric biopsy was positive for  Helicobacter by Warthin-Starry silver stain.  She was started on bismuth, Protonix, Flagyl and tetracycline.  She had immediate problems tolerating tetracycline.  It caused nausea and occasional vomiting.  She was able to tolerate her other medications.  She has some ondansetron at home but did not try taking that for her nausea.  Tetracycline was changed to clarithromycin but that caused epigastric pain and a feeling like she was going to explode.  She is allergic to penicillins stating that they cause swelling.    Review of Systems: Review of Systems  Constitutional: Negative for fever and weight loss.  Respiratory: Negative for cough.   Cardiovascular: Positive for chest pain.  Gastrointestinal: Positive for abdominal pain, heartburn, nausea and vomiting.  Musculoskeletal: Positive for back pain.  Neurological: Positive for tremors.  Psychiatric/Behavioral: The patient is nervous/anxious.       Past Medical History:  Diagnosis Date  . Allergy   . Anxiety   . Arthritis   . Benign essential tremor   . CMC arthritis    right thumb  . Depression   . GERD (gastroesophageal reflux disease)    OTC meds prn  . Heart murmur    when pregnant-echo in quate-nothing showed up  . History of pneumonia   . Hypotension   . Hypothyroidism   . Low BP   . Neuromuscular disorder ([place])   . PUD (peptic ulcer disease)   . Wears glasses     Social History   Tobacco Use  . Smoking status: Former Smoker    Quit date: 04/28/1973    Years since quitting: 46.7  . Smokeless tobacco: Never Used  Vaping Use  . Vaping Use: Never assessed  Substance Use Topics  . Alcohol use: No  . Drug use: Yes    Types: Marijuana    Comment: last use of marijuana 16 June 21    No family history on file. Allergies  Allergen Reactions  . Cortisone     Cortisone eye drops nausea and headache  . Gabapentin Anxiety    Tight chest,no appetite,shakey  . Other Diarrhea and Nausea And Vomiting    [name]  .  Penicillins Swelling    Arm swelling at injection site as a child  . Percocet [Oxycodone-Acetaminophen] Other (See Comments)    Felt like face was drooping    OBJECTIVE: Vitals:   01/29/20 0841  BP: 116/77  Pulse: 77  SpO2: 96%  Weight: 170 lb 6.4 oz (77.3 kg)   Body mass index is 28.36 kg/m.   Physical Exam Constitutional:      General: She is not in acute distress.    Appearance: Normal appearance.  Cardiovascular:  Rate and Rhythm: Normal rate and regular rhythm.     Heart sounds: No murmur heard.   Pulmonary:     Effort: Pulmonary effort is normal.     Breath sounds: Normal breath sounds.  Abdominal:     General: There is no distension.     Palpations: Abdomen is soft. There is no mass.     Tenderness: There is no abdominal tenderness.  Psychiatric:        Mood and Affect: Mood normal.     Microbiology: No results found for this or any previous visit (from the past 240 hour(s)).  Michel Bickers, MD [facility] for [facility] [phone] pager   [phone] cell 01/29/2020, 9:48 AM

## 2020-01-30 ENCOUNTER — Other Ambulatory Visit: Payer: Self-pay | Admitting: Gastroenterology

## 2020-02-04 ENCOUNTER — Telehealth: Payer: Self-pay | Admitting: *Deleted

## 2020-02-04 ENCOUNTER — Other Ambulatory Visit: Payer: Self-pay | Admitting: Internal Medicine

## 2020-02-04 MED ORDER — ONDANSETRON 4 MG PO TBDP: 4.0000 mg | ORAL_TABLET | Freq: Three times a day (TID) | ORAL | 0 refills | Status: DC | PRN

## 2020-02-04 MED ORDER — METRONIDAZOLE 500 MG PO TABS: 500.0000 mg | ORAL_TABLET | Freq: Four times a day (QID) | ORAL | 0 refills | Status: DC

## 2020-02-04 NOTE — Telephone Encounter (Signed)
Refills of metronidazole and ondansetron sent.

## 2020-02-04 NOTE — Telephone Encounter (Signed)
Notified patient. THanks!

## 2020-02-04 NOTE — Telephone Encounter (Signed)
Patient calling for partial refill of h. Pylori treatment (metronidazole and ondansetron only).  She has enough metronidazole and ondansetron to take through 7/22.  She started treatment the evening of 7/17, should complete treatment the morning of 7/31. She has doxycycline and can purchase pepto bismol ultra (over the counter).  She got a refill of protonix from Dr Tarri Glenn.   Needs refill of metronidazole 500 mg #34 (at 4 tablets a day x 8.5 days) and Ondansetron 4 mg #26 (3 a day x 8.5 days). These were written by another provider. Please advise if ok to send this refill. Landis Gandy, RN

## 2020-02-11 ENCOUNTER — Ambulatory Visit: Payer: Self-pay | Admitting: Allergy

## 2020-03-10 ENCOUNTER — Telehealth: Payer: Self-pay

## 2020-03-10 DIAGNOSIS — Z79899 Other long term (current) drug therapy: Secondary | ICD-10-CM | POA: Diagnosis not present

## 2020-03-10 NOTE — Telephone Encounter (Signed)
COVID-19 Pre-Screening Questions:03/10/20  Do you currently have a fever (>100 F), chills or unexplained body aches?NO  Are you currently experiencing new cough, shortness of breath, sore throat, runny nose? NO .  Have you been in contact with someone that is currently pending confirmation of Covid19 testing or has been confirmed to have the Covid19 virus?  *NO  **If the patient answers NO to ALL questions -  advise the patient to please call the clinic before coming to the office should any symptoms develop.     

## 2020-03-11 ENCOUNTER — Other Ambulatory Visit: Payer: Self-pay

## 2020-03-11 ENCOUNTER — Ambulatory Visit (INDEPENDENT_AMBULATORY_CARE_PROVIDER_SITE_OTHER): Payer: BC Managed Care – PPO | Admitting: Internal Medicine

## 2020-03-11 ENCOUNTER — Encounter: Payer: Self-pay | Admitting: Internal Medicine

## 2020-03-11 DIAGNOSIS — A048 Other specified bacterial intestinal infections: Secondary | ICD-10-CM | POA: Diagnosis not present

## 2020-03-11 NOTE — Assessment & Plan Note (Addendum)
Her acid reflux symptoms improved with therapy for H. pylori.  I will order H. pylori breath test today.  Addendum: Her H. pylori breath test came back negative.  Her infection has been cured.  I called her and informed her of this result.  She can follow-up here as needed.

## 2020-03-11 NOTE — Progress Notes (Addendum)
[facility] for Infectious Disease  Patient Active Problem List   Diagnosis Date Noted  . Helicobacter pylori (H. pylori) infection 01/29/2020    Priority: High  . Reflux esophagitis 01/29/2020  . Anxiety 01/29/2020    Patient's Medications  New Prescriptions   No medications on file  Previous Medications   ALPRAZOLAM (XANAX) 0.5 MG TABLET    Take 0.5 mg by mouth at bedtime as needed for sleep.   BIOTIN 10 MG TABS    Take by mouth.   BREWERS YEAST (YEAST EXTRACT PO)    Take 1 tablet by mouth daily.   CALCIUM-VITAMIN D (OSCAL 500/200 D-3) 500-200 MG-UNIT TABLET    Take 1,250 mg by mouth 2 (two) times daily.   COCONUT OIL 1000 MG CAPS    Take by mouth.   DICLOFENAC SODIUM (VOLTAREN) 1 % GEL    Apply 1 application topically daily as needed (back pain).    ESTRADIOL-ESTRIOL-PROGESTERONE (BIEST/PROGESTERONE TD)    Apply 1 application topically daily.   GLUCOSAMINE-CHONDROITIN 500-400 MG TABLET    Take 1 tablet by mouth 3 (three) times daily.   LUTEIN 20 MG TABS    Take by mouth.   OMEGA-3 FATTY ACIDS (FISH OIL) 1000 MG CAPS    Take by mouth.   ONDANSETRON (ZOFRAN-ODT) 4 MG DISINTEGRATING TABLET    Take 1 tablet (4 mg total) by mouth every 8 (eight) hours as needed.   PRESCRIPTION MEDICATION       PROGESTERONE (PROMETRIUM) 100 MG CAPSULE    Take 100 mg by mouth daily.   UNABLE TO FIND    Take 1 capsule by mouth daily. Med Name:CBD- for upset stomach   ZINC SULFATE (ZINC 15) 66 MG TABS    Take 1 tablet by mouth daily.  Modified Medications   No medications on file  Discontinued Medications   BISMUTH SUBSALICYLATE 937 MG TABS    Take 1 tablet (262 mg total) by mouth in the morning, at noon, in the evening, and at bedtime.   DOXYCYCLINE (VIBRA-TABS) 100 MG TABLET    Take 1 tablet (100 mg total) by mouth 2 (two) times daily.   METRONIDAZOLE (FLAGYL) 500 MG TABLET    Take 1 tablet (500 mg total) by mouth 4 (four) times daily.   PANTOPRAZOLE (PROTONIX) 40 MG TABLET    Take 1  tablet by mouth twice daily    Subjective: Jonia is in for her routine follow-up visit.  She was able to complete her 4 drug regimen of bismuth, Protonix, metronidazole and doxycycline 4 weeks ago for H. pylori therapy.  She is feeling better.  She is not having as much epigastric discomfort and finds that she is able to eat certain foods that she could not tolerate before she was treated.  Review of Systems: Review of Systems  Gastrointestinal: Positive for heartburn and nausea. Negative for abdominal pain, diarrhea and vomiting.    Past Medical History:  Diagnosis Date  . Allergy   . Anxiety   . Arthritis   . Benign essential tremor   . CMC arthritis    right thumb  . Depression   . GERD (gastroesophageal reflux disease)    OTC meds prn  . Heart murmur    when pregnant-echo in quate-nothing showed up  . History of pneumonia   . Hypotension   . Hypothyroidism   . Low BP   . Neuromuscular disorder ([place])   . PUD (peptic ulcer disease)   .  Wears glasses     Social History   Tobacco Use  . Smoking status: Former Smoker    Quit date: 04/28/1973    Years since quitting: 46.9  . Smokeless tobacco: Never Used  Vaping Use  . Vaping Use: Never assessed  Substance Use Topics  . Alcohol use: No  . Drug use: Yes    Types: Marijuana    Comment: last use of marijuana 16 June 21    No family history on file.  Allergies  Allergen Reactions  . Cortisone     Cortisone eye drops nausea and headache  . Gabapentin Anxiety    Tight chest,no appetite,shakey  . Other Diarrhea and Nausea And Vomiting    [name]  . Penicillins Swelling    Arm swelling at injection site as a child  . Percocet [Oxycodone-Acetaminophen] Other (See Comments)    Felt like face was drooping    Objective: Vitals:   03/11/20 0920  BP: 114/85  Pulse: 84  SpO2: 96%  Weight: 169 lb (76.7 kg)   Body mass index is 28.12 kg/m.  Physical Exam Constitutional:      Comments: She is very pleasant and  talkative.  She asks for advice on treatments for her benign essential tremor and chronic pain.  Cardiovascular:     Rate and Rhythm: Normal rate and regular rhythm.     Heart sounds: No murmur heard.   Pulmonary:     Effort: Pulmonary effort is normal.     Breath sounds: Normal breath sounds.  Abdominal:     General: There is no distension.     Palpations: Abdomen is soft.     Tenderness: There is no abdominal tenderness.  Psychiatric:        Mood and Affect: Mood normal.       Problem List Items Addressed This Visit      High   Helicobacter pylori (H. pylori) infection    Her acid reflux symptoms improved with therapy for H. pylori.  I will order H. pylori breath test today.  Addendum: Her H. pylori breath test came back negative.  Her infection has been cured.  I called her and informed her of this result.  She can follow-up here as needed.      Relevant Orders   H. pylori breath test (Completed)       Michel Bickers, MD [facility] for [place] [phone] pager   [phone] cell 03/12/2020, 1:06 PM

## 2020-03-12 LAB — H. PYLORI BREATH TEST: H. pylori Breath Test: NOT DETECTED

## 2020-03-18 IMAGING — CT CT ABD-PELV W/ CM
2 of 5 series · 16 of 46 positions shown, 18 images · IV contrast (omnipaque)
Comparison: 01/19/2014

CLINICAL DATA: Pt c/o left sided flank pain . Pt states she has had
burning from her flank wrapping around to her groin. Pt states that
the pain felt like it burst, and then restarted.Pt states no relief
with muscle relaxers. Pt states the most relief with advil

EXAM:
CT ABDOMEN AND PELVIS WITH CONTRAST
TECHNIQUE: Multidetector CT imaging of the abdomen and pelvis was performed
using the standard protocol following bolus administration of
intravenous contrast.
CONTRAST:  100mL OMNIPAQUE IOHEXOL 300 MG/ML  SOLN

[Series 4: coronal st · coronal · 0.73mm/px · 3 of 138 slices shown]
[im 46/138  soft-tissue]
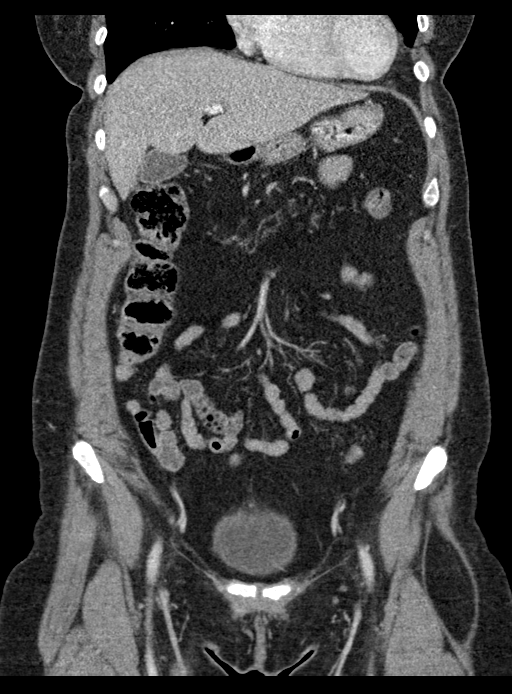
[im 61/138  soft-tissue]
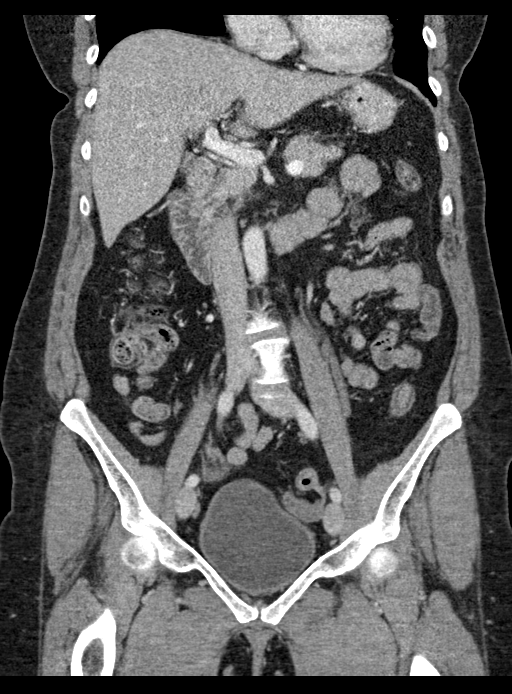
[im 77/138  soft-tissue]
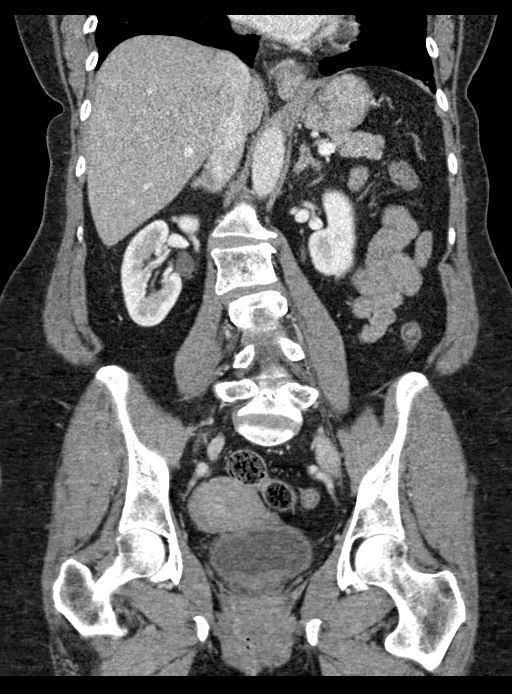

[Series 6: axial st · axial · 0.70mm/px · z∈[-522,-107]mm · 13 of 97 slices shown, 15 images]
[im 7/97  soft-tissue]
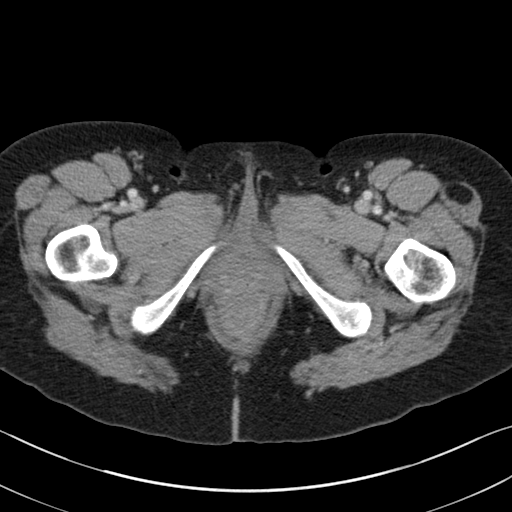
[im 7/97  bone]
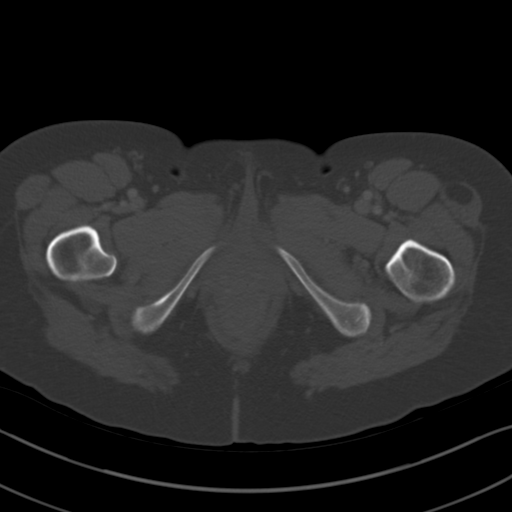
[im 13/97  soft-tissue]
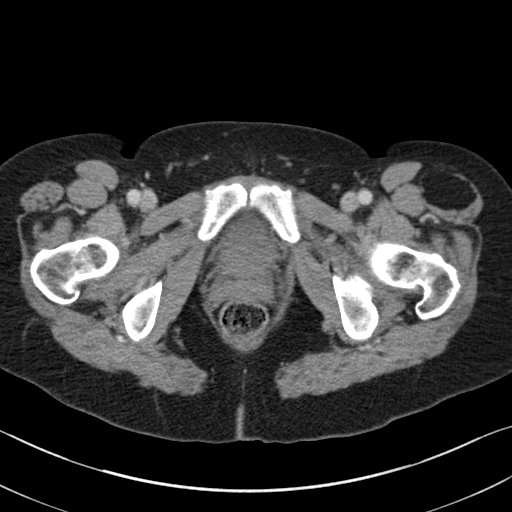
[im 20/97  soft-tissue]
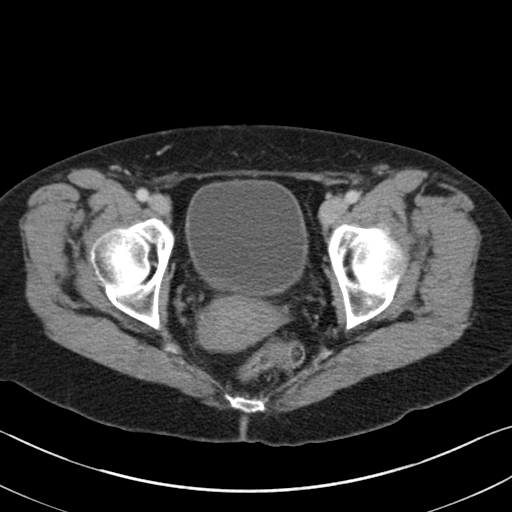
[im 26/97  soft-tissue]
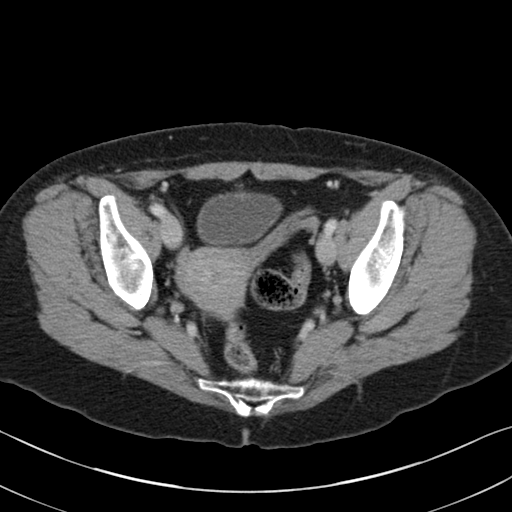
[im 33/97  soft-tissue]
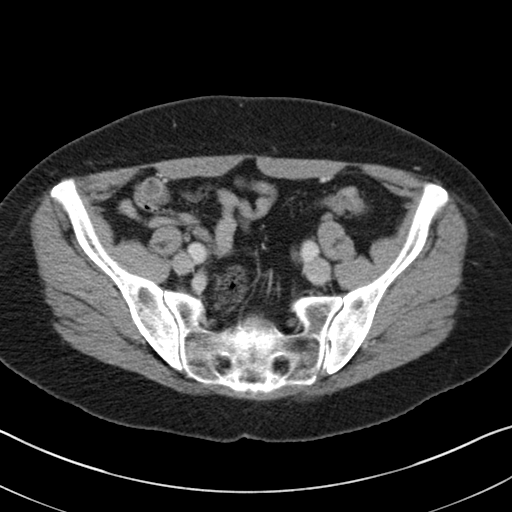
[im 39/97  soft-tissue]
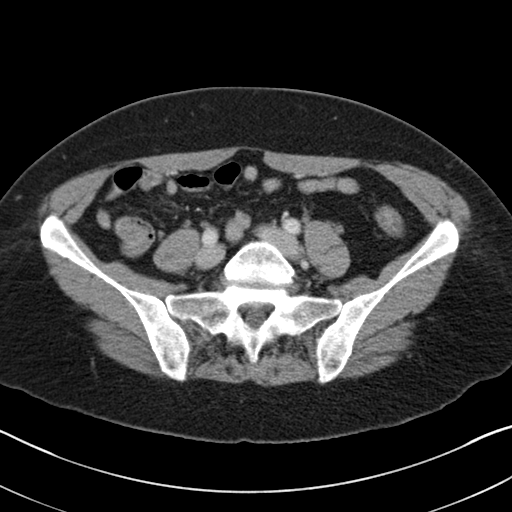
[im 52/97  soft-tissue]
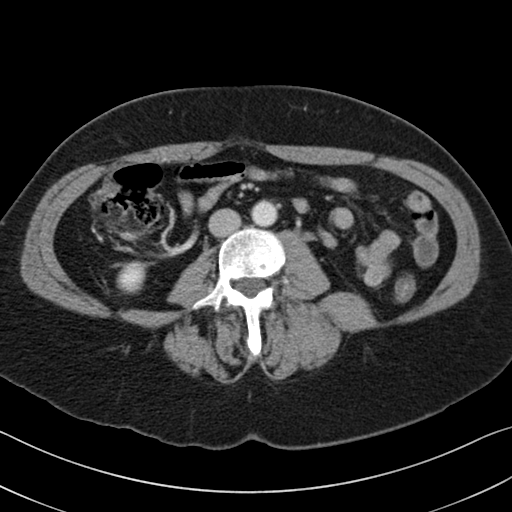
[im 58/97  soft-tissue]
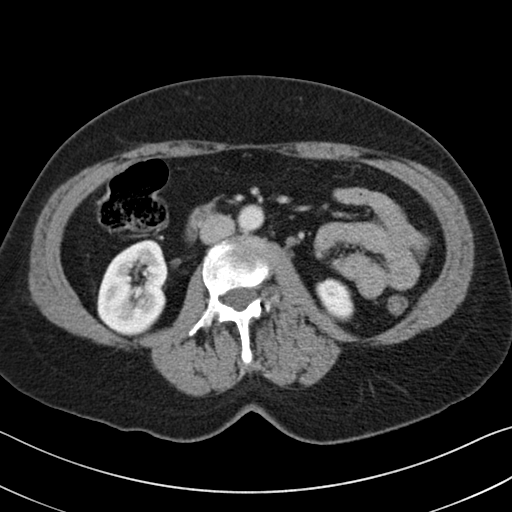
[im 65/97  soft-tissue]
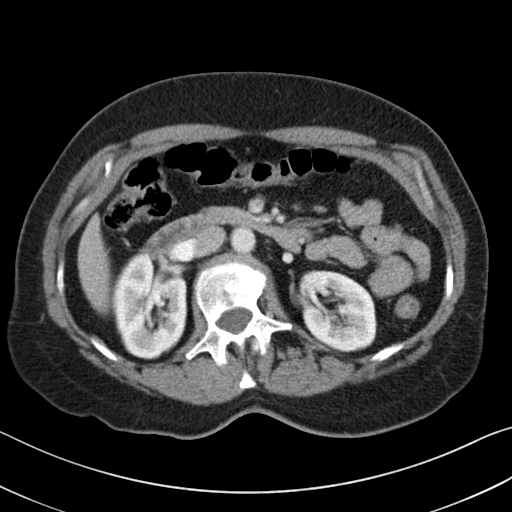
[im 65/97  bone]
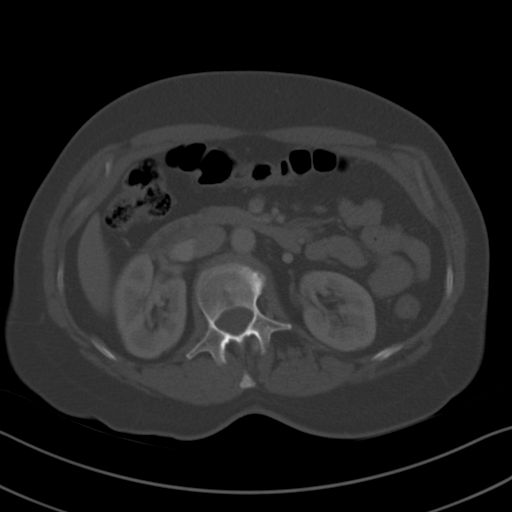
[im 71/97  soft-tissue]
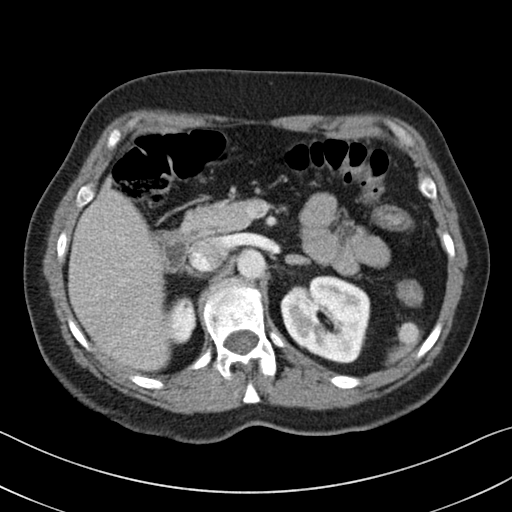
[im 77/97  soft-tissue]
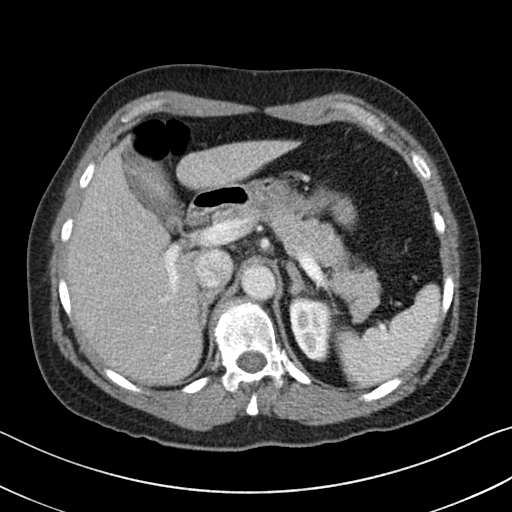
[im 84/97  soft-tissue]
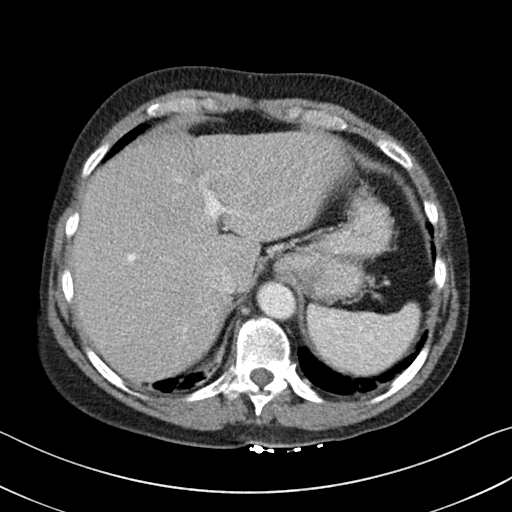
[im 90/97  soft-tissue]
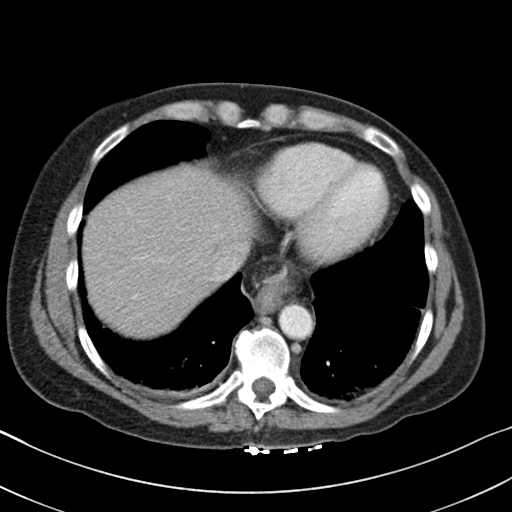

[16 of 46 positions shown; findings below may reference images not displayed]

FINDINGS: Lower chest: No acute abnormality.

Hepatobiliary: No focal liver abnormality is seen. No gallstones,
gallbladder wall thickening, or biliary dilatation.

Pancreas: Unremarkable. No pancreatic ductal dilatation or
surrounding inflammatory changes.

Spleen: Normal in size without focal abnormality.

Adrenals/Urinary Tract: No adrenal masses.

Kidneys normal size, orientation and position. 3 mm low-density
lesion in the upper pole the right kidney too small to characterize
but likely a cyst. No other renal masses, no stones and no
hydronephrosis. Ureters are normal in course and in caliber. Bladder
is unremarkable.

Stomach/Bowel: Small hiatal hernia. Stomach otherwise unremarkable.
Small bowel and colon are normal in caliber. No wall thickening. No
inflammation. Normal appendix visualized.

Vascular/Lymphatic: No significant vascular findings are present. No
enlarged abdominal or pelvic lymph nodes.

Reproductive: Uterus and bilateral adnexa are unremarkable.

Other: No abdominal wall hernia or abnormality. No abdominopelvic
ascites.

Musculoskeletal: Lipoma in the left anterior quadriceps muscle.

No fracture or acute finding. Chronic bilateral pars defects at
L5-S1. Scoliosis of the thoracolumbar spine. No bone lesion.
IMPRESSION: 1. No acute findings within the abdomen or pelvis. No findings to
account for the patient's symptoms.

## 2020-03-23 ENCOUNTER — Other Ambulatory Visit: Payer: Self-pay

## 2020-03-23 ENCOUNTER — Encounter: Payer: Self-pay | Admitting: Allergy & Immunology

## 2020-03-23 ENCOUNTER — Ambulatory Visit (INDEPENDENT_AMBULATORY_CARE_PROVIDER_SITE_OTHER): Payer: BC Managed Care – PPO | Admitting: Allergy & Immunology

## 2020-03-23 VITALS — BP 110/72 | HR 88 | Temp 98.9°F | Resp 16 | Ht 65.0 in | Wt 169.2 lb

## 2020-03-23 DIAGNOSIS — T63481D Toxic effect of venom of other arthropod, accidental (unintentional), subsequent encounter: Secondary | ICD-10-CM | POA: Diagnosis not present

## 2020-03-23 DIAGNOSIS — T7840XD Allergy, unspecified, subsequent encounter: Secondary | ICD-10-CM | POA: Diagnosis not present

## 2020-03-23 NOTE — Patient Instructions (Addendum)
1. Allergic reaction - presumed tannin sensitivity  - We are getting a lot of blood work today (including grains, fruits, and vegetables). - We are getting a stinging insect panel. - We are also looking to mast cell activity with a serum tryptase. - We will call you in 1-2 weeks with the results of the testing. - I think a referral to an Registered Dietician would be helpful. - Anaphylaxis management plan provided (anaphylaxis does NOT have to include breathing difficulty to count as anaphylaxis). - EpiPen training reviewed.   2. Return to be determined based on lab work.    Please inform us of any Emergency Department visits, hospitalizations, or changes in symptoms. Call us before going to the ED for breathing or allergy symptoms since we might be able to fit you in for a sick visit. Feel free to contact us anytime with any questions, problems, or concerns.  It was a pleasure to meet you today!  Websites that have reliable patient information: 1. American Academy of Asthma, Allergy, and Immunology: www.aaaai.org 2. Food Allergy Research and Education (FARE): foodallergy.org 3. Mothers of Asthmatics: http://www.asthmacommunitynetwork.org 4. American College of Allergy, Asthma, and Immunology: www.acaai.org   COVID-19 Vaccine Information can be found at: ShippingScam.co.uk For questions related to vaccine distribution or appointments, please email [email or web address] or call [phone].     "Like" [place] on Facebook and Instagram for our latest updates!        Make sure you are registered to vote! If you have moved or changed any of your contact information, you will need to get this updated before voting!  In some cases, you MAY be able to register to vote online: [email or web address]

## 2020-03-23 NOTE — Progress Notes (Signed)
NEW PATIENT  Date of Service/Encounter:  03/23/20  Referring provider: Roetta Sessions, NP   Assessment:   Allergic reaction - seems to be associated with tannin exposure  Stinging insect allergy - consistent of large local reactions that cross joints  Plan/Recommendations:   1. Allergic reaction - presumed tannin sensitivity  - We are getting a lot of blood work today (including grains, fruits, and vegetables). - We are getting a stinging insect panel. - We are also looking to mast cell activity with a serum tryptase. - We will call you in 1-2 weeks with the results of the testing. - I think a referral to an Registered Dietician would be helpful. - Anaphylaxis management plan provided (anaphylaxis does NOT have to include breathing difficulty to count as anaphylaxis). - EpiPen training reviewed.   2. Return to be determined based on lab work.   Subjective:   TALITHA DICARLO is a 64 y.o. female presenting today for evaluation of  Chief Complaint  Patient presents with  . Food Intolerance    Iman A Al-Qimlass has a history of the following: Patient Active Problem List   Diagnosis Date Noted  . Helicobacter pylori (H. pylori) infection 01/29/2020  . Reflux esophagitis 01/29/2020  . Anxiety 01/29/2020    History obtained from: chart review and patient.  Kathie Dike Al-Qimlass was referred by Roetta Sessions, NP.     Justeen is a 64 y.o. female presenting for an evaluation of concern for food allergies. She reports that she is having problems with emesis with exposure to wine as well as beer.   Her history is rather all over the place, but it seems that her reaction consisted of "severe shaking".  She also endorses some throat itching and swelling. This extends to wine but now includes beer and hard liquors as well. She tells me that she has undergone an endoscopy which was normal aside from healing of bacteria.  She was treated for this and had a repeat test  performed at the end of August.  Those results are pending at the time of her visit.  In any case, she reports that she has issues with tannins.  She actually has a handout on avoidance of tannins, recommending avoidance of certain grains as well as fruits and vegetables and specific drinks.  She is very insistent on testing specifically lab testing, although I did tell her that the testing is likely to show negative and she still needs to avoid tannins anyway.    She brought up in [place] test performed which showed, unsurprisingly, sensitivity to a number of different foods.  She just wants to know what she can eat.  She has not seen a Firefighter.  She has no allergic rhinitis history.  She said she did have some hayfever symptoms at one time which are treated by a chiropractor.  She is not interested in environmental allergy testing.  She does have a history of swelling to stinging insects.  This swelling can past 1-2 joints and last for several days.  She has never had anaphylaxis including breathing problems or throat swelling.  She did have an EpiPen, but it is out of date.  Is not really interested in getting another one.  She has had a lot of labs performed.  These were all done in July 2016.  She had a CBC at the time which showed an elevated white blood count.  She had elevated MCV and MCH.  Metabolic panel was  normal.  She had some kind of hormone work-up including FSH and LH as well as DHEA.  All of this was normal.  She had a Lyme Western blot sent which was negative she had some cytochrome P450 genetic testing sent to look at her response to various psychiatric drugs and benzodiazepines. It is unclear how this changed her management and she is really unsure how these results were utilized .  Otherwise, there is no history of other atopic diseases, including asthma, drug allergies, environmental allergies, stinging insect allergies, eczema, urticaria or contact dermatitis. There  is no significant infectious history. Vaccinations are up to date.    Past Medical History: Patient Active Problem List   Diagnosis Date Noted  . Helicobacter pylori (H. pylori) infection 01/29/2020  . Reflux esophagitis 01/29/2020  . Anxiety 01/29/2020    Medication List:  Allergies as of 03/23/2020      Reactions   Cortisone    Cortisone eye drops nausea and headache   Gabapentin Anxiety   Tight chest,no appetite,shakey   Other Diarrhea, Nausea And Vomiting   [name]   Penicillins Swelling   Arm swelling at injection site as a child   Percocet [oxycodone-acetaminophen] Other (See Comments)   Felt like face was drooping      Medication List       Accurate as of March 23, 2020 11:59 PM. If you have any questions, ask your nurse or doctor.        STOP taking these medications   Biotin 10 MG Tabs Stopped by: Valentina Shaggy, MD   Coconut Oil 1000 MG Caps Stopped by: Valentina Shaggy, MD   diclofenac Sodium 1 % Gel Commonly known as: VOLTAREN Stopped by: Valentina Shaggy, MD   glucosamine-chondroitin 500-400 MG tablet Stopped by: Valentina Shaggy, MD   Lutein 20 MG Tabs Stopped by: Valentina Shaggy, MD   ondansetron 4 MG disintegrating tablet Commonly known as: ZOFRAN-ODT Stopped by: Valentina Shaggy, MD   Oscal 500/200 D-3 500-200 MG-UNIT tablet Generic drug: calcium-vitamin D Stopped by: Valentina Shaggy, MD   PRESCRIPTION MEDICATION Stopped by: Valentina Shaggy, MD   progesterone 100 MG capsule Commonly known as: PROMETRIUM Stopped by: Valentina Shaggy, MD   UNABLE TO FIND Stopped by: Valentina Shaggy, MD   YEAST EXTRACT PO Stopped by: Valentina Shaggy, MD   Zinc 15 66 MG Tabs Generic drug: Zinc Sulfate Stopped by: Valentina Shaggy, MD     TAKE these medications   ALPRAZolam 1 MG tablet Commonly known as: XANAX Take 0.5-1 mg by mouth daily as needed. What changed: Another medication with  the same name was removed. Continue taking this medication, and follow the directions you see here. Changed by: Valentina Shaggy, MD   BIEST/PROGESTERONE TD Apply 1 application topically daily.   Fish Oil 1000 MG Caps Take by mouth.       Birth History: non-contributory  Developmental History: non-contributory  Past Surgical History: Past Surgical History:  Procedure Laterality Date  . CARPOMETACARPEL SUSPENSION PLASTY Right 02/13/2017   Procedure: EXCISION TRAPEZIUM ABDUCTOR POLLICIS LONGUS TRANSFER RIGHT THUMB;  Surgeon: Daryll Brod, MD;  Location: [facility];  Service: Orthopedics;  Laterality: Right;  . EXCISION METACARPAL MASS Right 05/01/2013   Procedure: EXCISIONAL BIOPSY OF RIGHT THUMB;  Surgeon: Cammie Sickle., MD;  Location: [facility];  Service: Orthopedics;  Laterality: Right;  . HERNIA REPAIR  1990   bilat ing  .  TUBAL LIGATION    . WISDOM TOOTH EXTRACTION       Family History: Family History  Problem Relation Age of Onset  . Allergic rhinitis Neg Hx   . Angioedema Neg Hx   . Asthma Neg Hx   . Atopy Neg Hx   . Eczema Neg Hx   . Immunodeficiency Neg Hx   . Urticaria Neg Hx      Social History: Anthea lives at home with her husband. She lives in a tome home that is 64 years old. There is laminate and rubs in the main living areas and the same within the bedroom. There are dogs inside of the home. There are no dust mite coverings on the bedding. There is no tobacco exposure in the home. There is no exposure to fumes, chemicals, or dust. There is a HEPA filter in the home. There is no tobacco exposure.    Review of Systems  Constitutional: Negative.  Negative for fever, malaise/fatigue and weight loss.  HENT: Negative.  Negative for congestion, ear discharge, ear pain, sinus pain and sore throat.   Eyes: Negative for pain, discharge and redness.  Respiratory: Negative for cough, sputum production, shortness of breath and  wheezing.   Cardiovascular: Negative.  Negative for chest pain and palpitations.  Gastrointestinal: Positive for abdominal pain, nausea and vomiting. Negative for heartburn.  Skin: Positive for itching and rash.  Neurological: Negative for dizziness and headaches.  Endo/Heme/Allergies: Negative for environmental allergies. Does not bruise/bleed easily.       Objective:   Blood pressure 110/72, pulse 88, temperature 98.9 F (37.2 C), temperature source Temporal, resp. rate 16, height 5\' 5"  (1.651 m), weight 169 lb 3.2 oz (76.7 kg), SpO2 97 %. Body mass index is 28.16 kg/m.   Physical Exam:   Physical Exam Constitutional:      Appearance: She is well-developed.     Comments: Very talkative.   HENT:     Head: Normocephalic and atraumatic.     Right Ear: Tympanic membrane, ear canal and external ear normal. No drainage, swelling or tenderness. Tympanic membrane is not injected, scarred, erythematous, retracted or bulging.     Left Ear: Tympanic membrane, ear canal and external ear normal. No drainage, swelling or tenderness. Tympanic membrane is not injected, scarred, erythematous, retracted or bulging.     Nose: No nasal deformity, septal deviation, mucosal edema or rhinorrhea.     Right Sinus: No maxillary sinus tenderness or frontal sinus tenderness.     Left Sinus: No maxillary sinus tenderness or frontal sinus tenderness.     Mouth/Throat:     Mouth: Mucous membranes are not pale and not dry.     Pharynx: Uvula midline.  Eyes:     General:        Right eye: No discharge.        Left eye: No discharge.     Conjunctiva/sclera: Conjunctivae normal.     Right eye: Right conjunctiva is not injected. No chemosis.    Left eye: Left conjunctiva is not injected. No chemosis.    Pupils: Pupils are equal, round, and reactive to light.  Cardiovascular:     Rate and Rhythm: Normal rate and regular rhythm.     Heart sounds: Normal heart sounds.  Pulmonary:     Effort: Pulmonary  effort is normal. No tachypnea, accessory muscle usage or respiratory distress.     Breath sounds: Normal breath sounds. No wheezing, rhonchi or rales.  Chest:     Chest  wall: No tenderness.  Abdominal:     Tenderness: There is no abdominal tenderness. There is no guarding or rebound.  Lymphadenopathy:     Head:     Right side of head: No submandibular, tonsillar or occipital adenopathy.     Left side of head: No submandibular, tonsillar or occipital adenopathy.     Cervical: No cervical adenopathy.  Skin:    Coloration: Skin is not pale.     Findings: No abrasion, erythema, petechiae or rash. Rash is not papular, urticarial or vesicular.  Neurological:     Mental Status: She is alert.      Diagnostic studies: labs sent        Salvatore Marvel, MD [facility]

## 2020-03-25 ENCOUNTER — Encounter: Payer: Self-pay | Admitting: Allergy & Immunology

## 2020-03-25 LAB — ALLERGEN PROFILE, VEGETABLE II
Allergen Carrot IgE: <0.1 kU/L
Allergen Green Bean IgE: <0.1 kU/L
Allergen Green Pea IgE: <0.1 kU/L
Allergen Onion IgE: <0.1 kU/L
Allergen Potato, White IgE: <0.1 kU/L
Allergen Tomato, IgE: <0.1 kU/L
Kidney Bean IgE: <0.1 kU/L
Pumpkin IgE: <0.1 kU/L
Soybean IgE: <0.1 kU/L

## 2020-03-25 LAB — ALLERGEN, CINNAMON, RF220: Allergen Cinnamon IgE: <0.1 kU/L

## 2020-03-25 LAB — ALLERGEN, CORN F8: Allergen Corn, IgE: <0.1 kU/L

## 2020-03-25 LAB — ALLERGEN CHOCOLATE: Chocolate/Cacao IgE: <0.1 kU/L

## 2020-03-25 LAB — ALLERGEN, GRAPEFRUIT, F209: Allergen Grapefruit IgE: <0.1 kU/L

## 2020-03-30 LAB — ALLERGEN, BEAN BLACK
Black Bean*, IgE: <0.35 kU/L (ref <0.35)
Class Interpretation: 0

## 2020-03-30 LAB — ALLERGEN, RICE, F9: Allergen Rice IgE: <0.1 kU/L

## 2020-03-30 LAB — ALLERGEN STINGING INSECT PANEL
Honeybee IgE: <0.1 kU/L
Hornet, White Face, IgE: <0.1 kU/L
Hornet, Yellow, IgE: <0.1 kU/L
Paper Wasp IgE: <0.1 kU/L
Yellow Jacket, IgE: 0.12 kU/L — AB

## 2020-03-30 LAB — ALLERGEN PROFILE, FOOD-FRUIT
Allergen Apple, IgE: <0.1 kU/L
Allergen Banana IgE: <0.1 kU/L
Allergen Grape IgE: <0.1 kU/L
Allergen Pear IgE: <0.1 kU/L
Allergen, Peach f95: <0.1 kU/L

## 2020-03-30 LAB — ALLERGEN, BAKERS YEAST, F45: F045-IgE Yeast: <0.1 kU/L

## 2020-03-30 LAB — ALLERGEN, COFFEE, RF221: Coffee: <0.1 kU/L

## 2020-03-30 LAB — ALLERGEN, GREEN BEAN, RF315: Allergen Green Bean IgE: <0.1 kU/L

## 2020-03-30 LAB — F222-IGE TEA: F222-IgE Tea: <0.1 kU/L

## 2020-03-30 LAB — ALLERGEN,OAT,F7: Allergen Oat IgE: <0.1 kU/L

## 2020-03-30 LAB — ALLERGEN BARLEY F6: Allergen Barley IgE: <0.1 kU/L

## 2020-03-30 LAB — ALLERGEN, WHEAT, F4: Wheat IgE: <0.1 kU/L

## 2020-03-30 LAB — TRYPTASE: Tryptase: 5 ug/L (ref 2.2–13.2)

## 2020-03-30 LAB — ALLERGEN, KIDNEY BEAN, RF287: Kidney Bean IgE: <0.1 kU/L

## 2020-10-10 DIAGNOSIS — H524 Presbyopia: Secondary | ICD-10-CM | POA: Diagnosis not present

## 2020-10-15 DIAGNOSIS — F4310 Post-traumatic stress disorder, unspecified: Secondary | ICD-10-CM | POA: Diagnosis not present

## 2020-10-22 DIAGNOSIS — F4310 Post-traumatic stress disorder, unspecified: Secondary | ICD-10-CM | POA: Diagnosis not present

## 2020-11-05 DIAGNOSIS — F4310 Post-traumatic stress disorder, unspecified: Secondary | ICD-10-CM | POA: Diagnosis not present

## 2020-11-22 DIAGNOSIS — F4310 Post-traumatic stress disorder, unspecified: Secondary | ICD-10-CM | POA: Diagnosis not present

## 2020-12-02 DIAGNOSIS — M5416 Radiculopathy, lumbar region: Secondary | ICD-10-CM | POA: Diagnosis not present

## 2020-12-02 DIAGNOSIS — M549 Dorsalgia, unspecified: Secondary | ICD-10-CM | POA: Diagnosis not present

## 2020-12-02 DIAGNOSIS — Z6828 Body mass index (BMI) 28.0-28.9, adult: Secondary | ICD-10-CM | POA: Diagnosis not present

## 2020-12-02 DIAGNOSIS — G250 Essential tremor: Secondary | ICD-10-CM | POA: Diagnosis not present

## 2020-12-06 DIAGNOSIS — L821 Other seborrheic keratosis: Secondary | ICD-10-CM | POA: Diagnosis not present

## 2020-12-06 DIAGNOSIS — L720 Epidermal cyst: Secondary | ICD-10-CM | POA: Diagnosis not present

## 2020-12-06 DIAGNOSIS — L814 Other melanin hyperpigmentation: Secondary | ICD-10-CM | POA: Diagnosis not present

## 2020-12-06 DIAGNOSIS — Z419 Encounter for procedure for purposes other than remedying health state, unspecified: Secondary | ICD-10-CM | POA: Diagnosis not present

## 2020-12-10 DIAGNOSIS — F4310 Post-traumatic stress disorder, unspecified: Secondary | ICD-10-CM | POA: Diagnosis not present

## 2020-12-23 DIAGNOSIS — M5412 Radiculopathy, cervical region: Secondary | ICD-10-CM | POA: Diagnosis not present

## 2020-12-23 DIAGNOSIS — M4306 Spondylolysis, lumbar region: Secondary | ICD-10-CM | POA: Diagnosis not present

## 2020-12-23 DIAGNOSIS — Z6828 Body mass index (BMI) 28.0-28.9, adult: Secondary | ICD-10-CM | POA: Diagnosis not present

## 2020-12-23 DIAGNOSIS — M47816 Spondylosis without myelopathy or radiculopathy, lumbar region: Secondary | ICD-10-CM | POA: Diagnosis not present

## 2020-12-23 DIAGNOSIS — M4125 Other idiopathic scoliosis, thoracolumbar region: Secondary | ICD-10-CM | POA: Diagnosis not present

## 2020-12-23 DIAGNOSIS — M549 Dorsalgia, unspecified: Secondary | ICD-10-CM | POA: Diagnosis not present

## 2020-12-23 DIAGNOSIS — G8929 Other chronic pain: Secondary | ICD-10-CM | POA: Diagnosis not present

## 2020-12-23 DIAGNOSIS — M5414 Radiculopathy, thoracic region: Secondary | ICD-10-CM | POA: Diagnosis not present

## 2021-01-08 DIAGNOSIS — M549 Dorsalgia, unspecified: Secondary | ICD-10-CM | POA: Diagnosis not present

## 2021-01-08 DIAGNOSIS — G8929 Other chronic pain: Secondary | ICD-10-CM | POA: Diagnosis not present

## 2021-01-08 DIAGNOSIS — M47814 Spondylosis without myelopathy or radiculopathy, thoracic region: Secondary | ICD-10-CM | POA: Diagnosis not present

## 2021-01-08 DIAGNOSIS — M4802 Spinal stenosis, cervical region: Secondary | ICD-10-CM | POA: Diagnosis not present

## 2021-01-08 DIAGNOSIS — M5186 Other intervertebral disc disorders, lumbar region: Secondary | ICD-10-CM | POA: Diagnosis not present

## 2021-01-08 DIAGNOSIS — M4804 Spinal stenosis, thoracic region: Secondary | ICD-10-CM | POA: Diagnosis not present

## 2021-01-08 DIAGNOSIS — M2578 Osteophyte, vertebrae: Secondary | ICD-10-CM | POA: Diagnosis not present

## 2021-01-08 DIAGNOSIS — M5031 Other cervical disc degeneration,  high cervical region: Secondary | ICD-10-CM | POA: Diagnosis not present

## 2021-01-12 DIAGNOSIS — M5414 Radiculopathy, thoracic region: Secondary | ICD-10-CM | POA: Diagnosis not present

## 2021-01-12 DIAGNOSIS — M5412 Radiculopathy, cervical region: Secondary | ICD-10-CM | POA: Diagnosis not present

## 2021-01-12 DIAGNOSIS — Z6828 Body mass index (BMI) 28.0-28.9, adult: Secondary | ICD-10-CM | POA: Diagnosis not present

## 2021-01-12 DIAGNOSIS — M47816 Spondylosis without myelopathy or radiculopathy, lumbar region: Secondary | ICD-10-CM | POA: Diagnosis not present

## 2021-01-28 DIAGNOSIS — G20 Parkinson's disease: Secondary | ICD-10-CM | POA: Diagnosis not present

## 2021-01-28 DIAGNOSIS — M5414 Radiculopathy, thoracic region: Secondary | ICD-10-CM | POA: Diagnosis not present

## 2021-01-28 DIAGNOSIS — M5412 Radiculopathy, cervical region: Secondary | ICD-10-CM | POA: Diagnosis not present

## 2021-01-28 DIAGNOSIS — Z8669 Personal history of other diseases of the nervous system and sense organs: Secondary | ICD-10-CM | POA: Diagnosis not present

## 2021-01-28 DIAGNOSIS — M47816 Spondylosis without myelopathy or radiculopathy, lumbar region: Secondary | ICD-10-CM | POA: Diagnosis not present

## 2021-02-04 DIAGNOSIS — F4310 Post-traumatic stress disorder, unspecified: Secondary | ICD-10-CM | POA: Diagnosis not present

## 2021-02-11 ENCOUNTER — Other Ambulatory Visit: Payer: Self-pay

## 2021-02-11 ENCOUNTER — Emergency Department (HOSPITAL_BASED_OUTPATIENT_CLINIC_OR_DEPARTMENT_OTHER)
Admission: EM | Admit: 2021-02-11 | Discharge: 2021-02-11 | Disposition: A | Discharge status: Home / Self Care | Payer: BC Managed Care – PPO | Attending: Emergency Medicine | Admitting: Emergency Medicine

## 2021-02-11 ENCOUNTER — Encounter (HOSPITAL_BASED_OUTPATIENT_CLINIC_OR_DEPARTMENT_OTHER): Payer: Self-pay | Admitting: *Deleted

## 2021-02-11 ENCOUNTER — Emergency Department (HOSPITAL_BASED_OUTPATIENT_CLINIC_OR_DEPARTMENT_OTHER): Payer: BC Managed Care – PPO | Admitting: Radiology

## 2021-02-11 DIAGNOSIS — E039 Hypothyroidism, unspecified: Secondary | ICD-10-CM | POA: Insufficient documentation

## 2021-02-11 DIAGNOSIS — Z87891 Personal history of nicotine dependence: Secondary | ICD-10-CM | POA: Insufficient documentation

## 2021-02-11 DIAGNOSIS — R0789 Other chest pain: Secondary | ICD-10-CM

## 2021-02-11 DIAGNOSIS — R0781 Pleurodynia: Secondary | ICD-10-CM | POA: Diagnosis not present

## 2021-02-11 DIAGNOSIS — S299XXA Unspecified injury of thorax, initial encounter: Secondary | ICD-10-CM | POA: Diagnosis not present

## 2021-02-11 NOTE — ED Notes (Signed)
Discharge instructions discussed with pt. Pt verbalized understanding. Pt stable and ambulatory.  °

## 2021-02-11 NOTE — ED Provider Notes (Addendum)
[facility] EMERGENCY DEPT Provider Note   CSN: [id number] Arrival date & time: 02/11/21  1142     History Chief Complaint  Patient presents with   Rib cage Pain    Diane Bryant is a 65 y.o. female.  The history is provided by the patient.  Chest Pain Pain location:  R chest (anterior chest beneath breast) Pain quality: sharp   Pain radiates to:  Does not radiate Pain severity:  Moderate Onset quality:  Sudden Duration:  2 weeks Timing:  Constant Progression:  Unchanged Chronicity:  New Context comment:  Occurred when reaching over to get a phone that she dropped on the floor; heard a pop Relieved by:  Rest Worsened by:  Deep breathing, movement and certain positions Ineffective treatments:  None tried Associated symptoms: no abdominal pain, no back pain, no cough, no fever, no palpitations, no shortness of breath and no vomiting       Past Medical History:  Diagnosis Date   Allergy    Anxiety    Arthritis    Benign essential tremor    CMC arthritis    right thumb   Depression    GERD (gastroesophageal reflux disease)    OTC meds prn   Heart murmur    when pregnant-echo in quate-nothing showed up   History of pneumonia    Hypotension    Hypothyroidism    Low BP    Neuromuscular disorder (HCC)    PUD (peptic ulcer disease)    Wears glasses     Patient Active Problem List   Diagnosis Date Noted   Helicobacter pylori (H. pylori) infection 01/29/2020   Reflux esophagitis 01/29/2020   Anxiety 01/29/2020    Past Surgical History:  Procedure Laterality Date   CARPOMETACARPEL SUSPENSION PLASTY Right 02/13/2017   Procedure: EXCISION TRAPEZIUM ABDUCTOR POLLICIS LONGUS TRANSFER RIGHT THUMB;  Surgeon: Daryll Brod, MD;  Location: [facility];  Service: Orthopedics;  Laterality: Right;   EXCISION METACARPAL MASS Right 05/01/2013   Procedure: EXCISIONAL BIOPSY OF RIGHT THUMB;  Surgeon: Cammie Sickle., MD;  Location: [facility];  Service: Orthopedics;  Laterality: Right;   HERNIA REPAIR  1990   bilat ing   TUBAL LIGATION     WISDOM TOOTH EXTRACTION       OB History     Gravida  6   Para  4   Term      Preterm      AB  2   Living         SAB      IAB      Ectopic      Multiple      Live Births              Family History  Problem Relation Age of Onset   Allergic rhinitis Neg Hx    Angioedema Neg Hx    Asthma Neg Hx    Atopy Neg Hx    Eczema Neg Hx    Immunodeficiency Neg Hx    Urticaria Neg Hx     Social History   Tobacco Use   Smoking status: Former    Types: Cigarettes    Quit date: 04/28/1973    Years since quitting: 47.8   Smokeless tobacco: Never  Vaping Use   Vaping Use: Never used  Substance Use Topics   Alcohol use: No   Drug use: Yes    Types: Marijuana    Comment: last use  of marijuana 16 June 21    Home Medications Prior to Admission medications   Medication Sig Start Date End Date Taking? Authorizing Provider  ALPRAZolam Duanne Moron) 1 MG tablet Take 0.5-1 mg by mouth daily as needed. 02/11/20   [provider]  Omega-3 Fatty Acids (FISH OIL) 1000 MG CAPS Take by mouth.    [provider]    Allergies    Cortisone, Gabapentin, Other, Penicillins, and Percocet [oxycodone-acetaminophen]  Review of Systems   Review of Systems  Constitutional:  Negative for chills and fever.  HENT:  Negative for ear pain and sore throat.   Eyes:  Negative for pain and visual disturbance.  Respiratory:  Negative for cough and shortness of breath.   Cardiovascular:  Positive for chest pain. Negative for palpitations.  Gastrointestinal:  Negative for abdominal pain and vomiting.  Genitourinary:  Negative for dysuria and hematuria.  Musculoskeletal:  Negative for arthralgias and back pain.  Skin:  Negative for color change and rash.  Neurological:  Negative for seizures and syncope.  All other systems reviewed and are negative.  Physical  Exam Updated Vital Signs Ht '5\' 5"'$  (1.651 m)   Wt 77.1 kg   BMI 28.29 kg/m   Physical Exam Vitals and nursing note reviewed.  Constitutional:      Appearance: She is well-developed.  HENT:     Head: Normocephalic and atraumatic.  Cardiovascular:     Rate and Rhythm: Normal rate and regular rhythm.     Heart sounds: Normal heart sounds.  Pulmonary:     Effort: Pulmonary effort is normal. No tachypnea.     Breath sounds: Normal breath sounds.  Chest:     Chest wall: Tenderness present. No swelling.       Comments: No crepitus Musculoskeletal:     Right lower leg: No edema.     Left lower leg: No edema.  Skin:    General: Skin is warm and dry.  Neurological:     General: No focal deficit present.     Mental Status: She is alert and oriented to person, place, and time.  Psychiatric:        Mood and Affect: Mood normal.        Behavior: Behavior normal.    ED Results / Procedures / Treatments   Labs (all labs ordered are listed, but only abnormal results are displayed) Labs Reviewed - No data to display  EKG None  Radiology No results found.  Procedures Procedures   Medications Ordered in ED Medications - No data to display  ED Course  I have reviewed the triage vital signs and the nursing notes.  Pertinent labs & imaging results that were available during my care of the patient were reviewed by me and considered in my medical decision making (see chart for details).    MDM Rules/Calculators/A&P                           Diane Bryant presents with right-sided chest wall pain.  I do think this is musculoskeletal in etiology.  It occurred from a discrete point in time and was associated with lateral reaching of the right arm.  X-ray within normal limits.  Possible costochondral strain or ligamentous injury.  Symptomatic treatment should suffice.  She was given return precautions. Final Clinical Impression(s) / ED Diagnoses Final diagnoses:  Chest wall  pain    Rx / DC Orders ED Discharge Orders  None        Arnaldo Natal, MD 02/11/21 1342    Arnaldo Natal, MD 02/11/21 [phone]

## 2021-02-11 NOTE — Discharge Instructions (Addendum)
Consider physical therapy

## 2021-02-11 NOTE — ED Triage Notes (Signed)
Right ribcage pain since July 15th when she pick up  her phone and the pain never went away.  When patient breathes in and moves a certain way, her right ribcage hurts.

## 2021-02-11 NOTE — ED Notes (Signed)
States on the 15th reached while sitting in chair for phone and hit right side on chair arm.  Felt a "pop".  Constant pain since worse with deep breathing.  Denies SOB

## 2021-02-16 DIAGNOSIS — M5126 Other intervertebral disc displacement, lumbar region: Secondary | ICD-10-CM | POA: Diagnosis not present

## 2021-02-16 DIAGNOSIS — M5416 Radiculopathy, lumbar region: Secondary | ICD-10-CM | POA: Diagnosis not present

## 2021-02-22 DIAGNOSIS — G20 Parkinson's disease: Secondary | ICD-10-CM | POA: Diagnosis not present

## 2021-02-28 DIAGNOSIS — G20 Parkinson's disease: Secondary | ICD-10-CM | POA: Diagnosis not present

## 2021-03-03 DIAGNOSIS — F4310 Post-traumatic stress disorder, unspecified: Secondary | ICD-10-CM | POA: Diagnosis not present

## 2021-03-03 DIAGNOSIS — F4542 Pain disorder with related psychological factors: Secondary | ICD-10-CM | POA: Diagnosis not present

## 2021-04-01 DIAGNOSIS — Z880 Allergy status to penicillin: Secondary | ICD-10-CM | POA: Diagnosis not present

## 2021-04-01 DIAGNOSIS — K219 Gastro-esophageal reflux disease without esophagitis: Secondary | ICD-10-CM | POA: Diagnosis not present

## 2021-04-01 DIAGNOSIS — M5116 Intervertebral disc disorders with radiculopathy, lumbar region: Secondary | ICD-10-CM | POA: Diagnosis not present

## 2021-04-01 DIAGNOSIS — A048 Other specified bacterial intestinal infections: Secondary | ICD-10-CM | POA: Diagnosis not present

## 2021-04-01 DIAGNOSIS — K449 Diaphragmatic hernia without obstruction or gangrene: Secondary | ICD-10-CM | POA: Diagnosis not present

## 2021-04-01 DIAGNOSIS — M5126 Other intervertebral disc displacement, lumbar region: Secondary | ICD-10-CM | POA: Diagnosis not present

## 2021-04-01 DIAGNOSIS — Z87891 Personal history of nicotine dependence: Secondary | ICD-10-CM | POA: Diagnosis not present

## 2021-04-01 DIAGNOSIS — M5416 Radiculopathy, lumbar region: Secondary | ICD-10-CM | POA: Diagnosis not present

## 2021-04-11 DIAGNOSIS — M5416 Radiculopathy, lumbar region: Secondary | ICD-10-CM | POA: Diagnosis not present

## 2021-04-11 DIAGNOSIS — M5126 Other intervertebral disc displacement, lumbar region: Secondary | ICD-10-CM | POA: Diagnosis not present

## 2021-04-11 DIAGNOSIS — Z09 Encounter for follow-up examination after completed treatment for conditions other than malignant neoplasm: Secondary | ICD-10-CM | POA: Diagnosis not present

## 2021-04-15 DIAGNOSIS — Z6829 Body mass index (BMI) 29.0-29.9, adult: Secondary | ICD-10-CM | POA: Diagnosis not present

## 2021-04-15 DIAGNOSIS — G20 Parkinson's disease: Secondary | ICD-10-CM | POA: Diagnosis not present

## 2021-04-29 DIAGNOSIS — M419 Scoliosis, unspecified: Secondary | ICD-10-CM | POA: Diagnosis not present

## 2021-04-29 DIAGNOSIS — R29898 Other symptoms and signs involving the musculoskeletal system: Secondary | ICD-10-CM | POA: Diagnosis not present

## 2021-04-29 DIAGNOSIS — F419 Anxiety disorder, unspecified: Secondary | ICD-10-CM | POA: Diagnosis not present

## 2021-04-29 DIAGNOSIS — Z880 Allergy status to penicillin: Secondary | ICD-10-CM | POA: Diagnosis not present

## 2021-04-29 DIAGNOSIS — Z885 Allergy status to narcotic agent status: Secondary | ICD-10-CM | POA: Diagnosis not present

## 2021-04-29 DIAGNOSIS — M5116 Intervertebral disc disorders with radiculopathy, lumbar region: Secondary | ICD-10-CM | POA: Diagnosis not present

## 2021-04-29 DIAGNOSIS — K219 Gastro-esophageal reflux disease without esophagitis: Secondary | ICD-10-CM | POA: Diagnosis not present

## 2021-04-29 DIAGNOSIS — M810 Age-related osteoporosis without current pathological fracture: Secondary | ICD-10-CM | POA: Diagnosis not present

## 2021-04-29 DIAGNOSIS — K449 Diaphragmatic hernia without obstruction or gangrene: Secondary | ICD-10-CM | POA: Diagnosis not present

## 2021-04-29 DIAGNOSIS — R200 Anesthesia of skin: Secondary | ICD-10-CM | POA: Diagnosis not present

## 2021-04-29 DIAGNOSIS — G250 Essential tremor: Secondary | ICD-10-CM | POA: Diagnosis not present

## 2021-04-29 DIAGNOSIS — M5126 Other intervertebral disc displacement, lumbar region: Secondary | ICD-10-CM | POA: Diagnosis not present

## 2021-04-29 DIAGNOSIS — M5416 Radiculopathy, lumbar region: Secondary | ICD-10-CM | POA: Diagnosis not present

## 2021-04-29 HISTORY — PX: OTHER SURGICAL HISTORY: SHX169

## 2021-05-23 DIAGNOSIS — Z09 Encounter for follow-up examination after completed treatment for conditions other than malignant neoplasm: Secondary | ICD-10-CM | POA: Diagnosis not present

## 2021-05-23 DIAGNOSIS — Z5189 Encounter for other specified aftercare: Secondary | ICD-10-CM | POA: Diagnosis not present

## 2021-05-24 DIAGNOSIS — G20 Parkinson's disease: Secondary | ICD-10-CM | POA: Diagnosis not present

## 2021-05-24 DIAGNOSIS — Z6829 Body mass index (BMI) 29.0-29.9, adult: Secondary | ICD-10-CM | POA: Diagnosis not present

## 2021-06-06 DIAGNOSIS — H00014 Hordeolum externum left upper eyelid: Secondary | ICD-10-CM | POA: Diagnosis not present

## 2021-06-17 DIAGNOSIS — F4312 Post-traumatic stress disorder, chronic: Secondary | ICD-10-CM | POA: Diagnosis not present

## 2021-08-23 LAB — COLOGUARD: COLOGUARD: NEGATIVE

## 2021-11-22 IMAGING — DX DG RIBS W/ CHEST 3+V*R*
3 series · 3 of 3 positions shown · non-contrast
Comparison: None.

CLINICAL DATA: Evaluate for rib fracture. Anterior inferior pain
after injury.

EXAM:
RIGHT RIBS AND CHEST - 3+ VIEW

[chest pa]
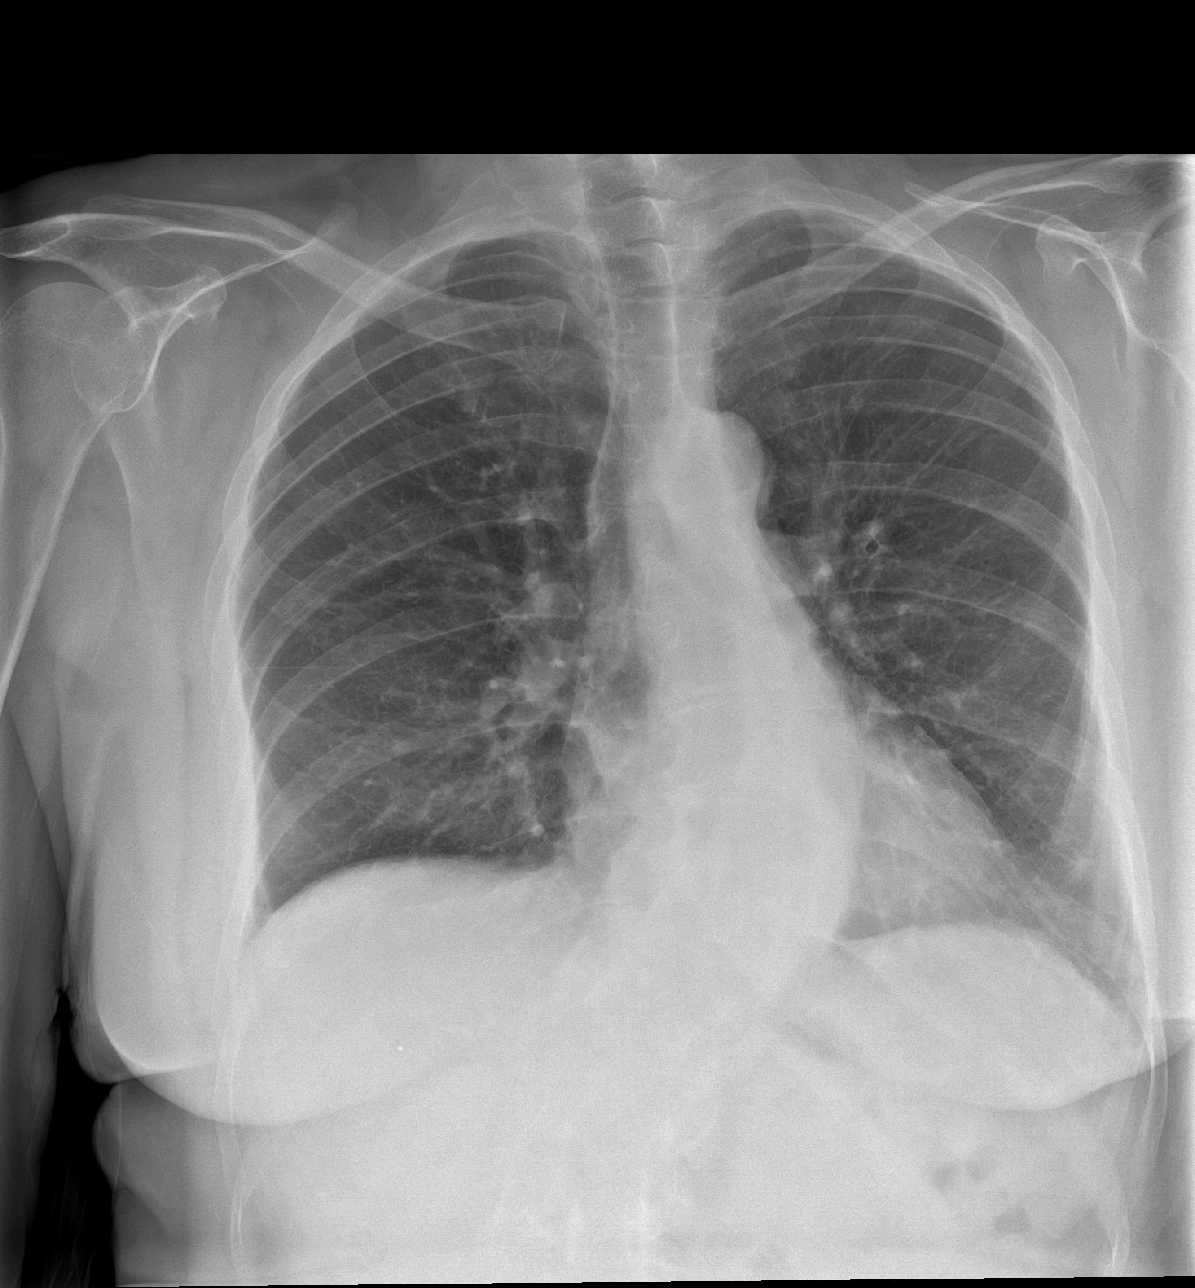

[rib pa]
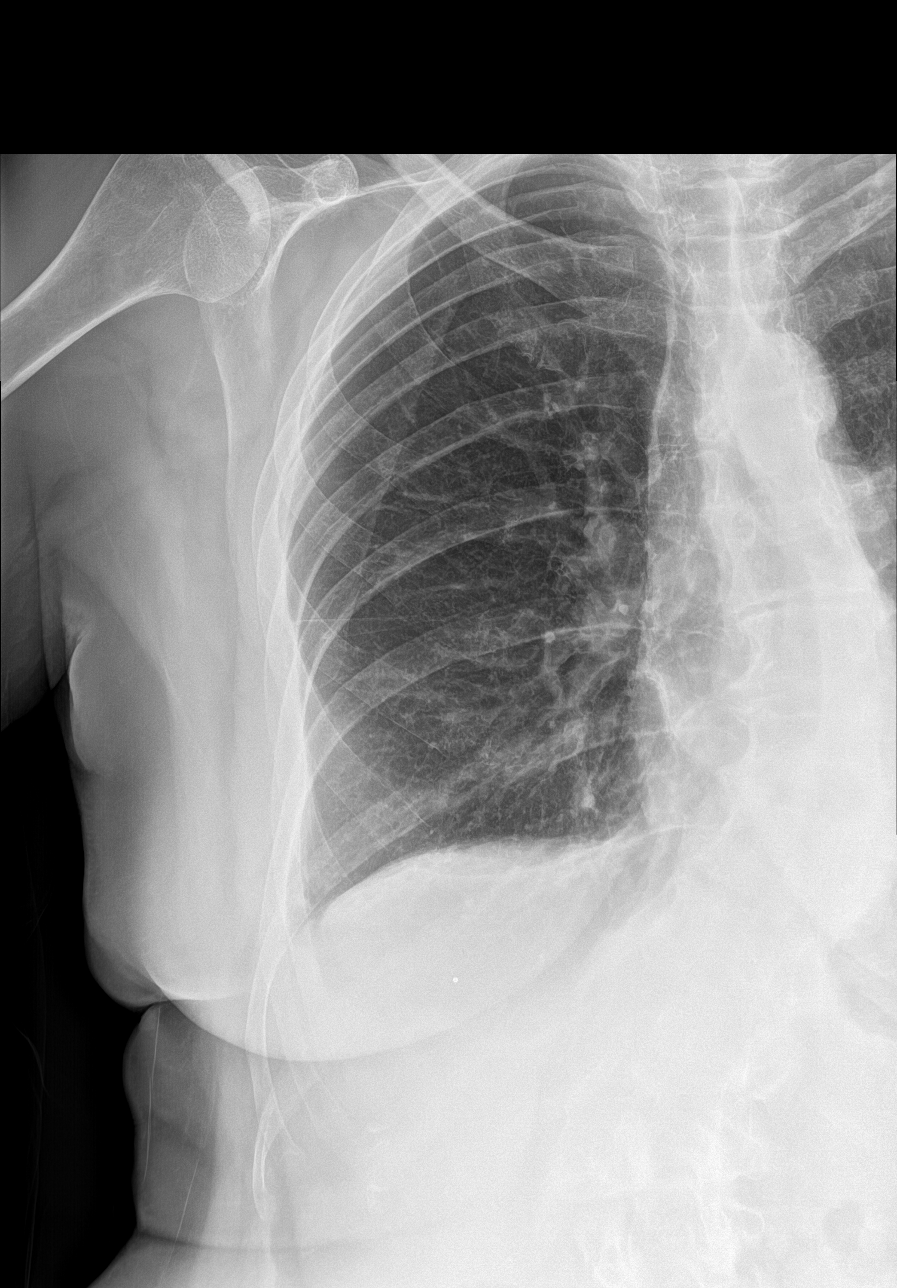

[rib obl]
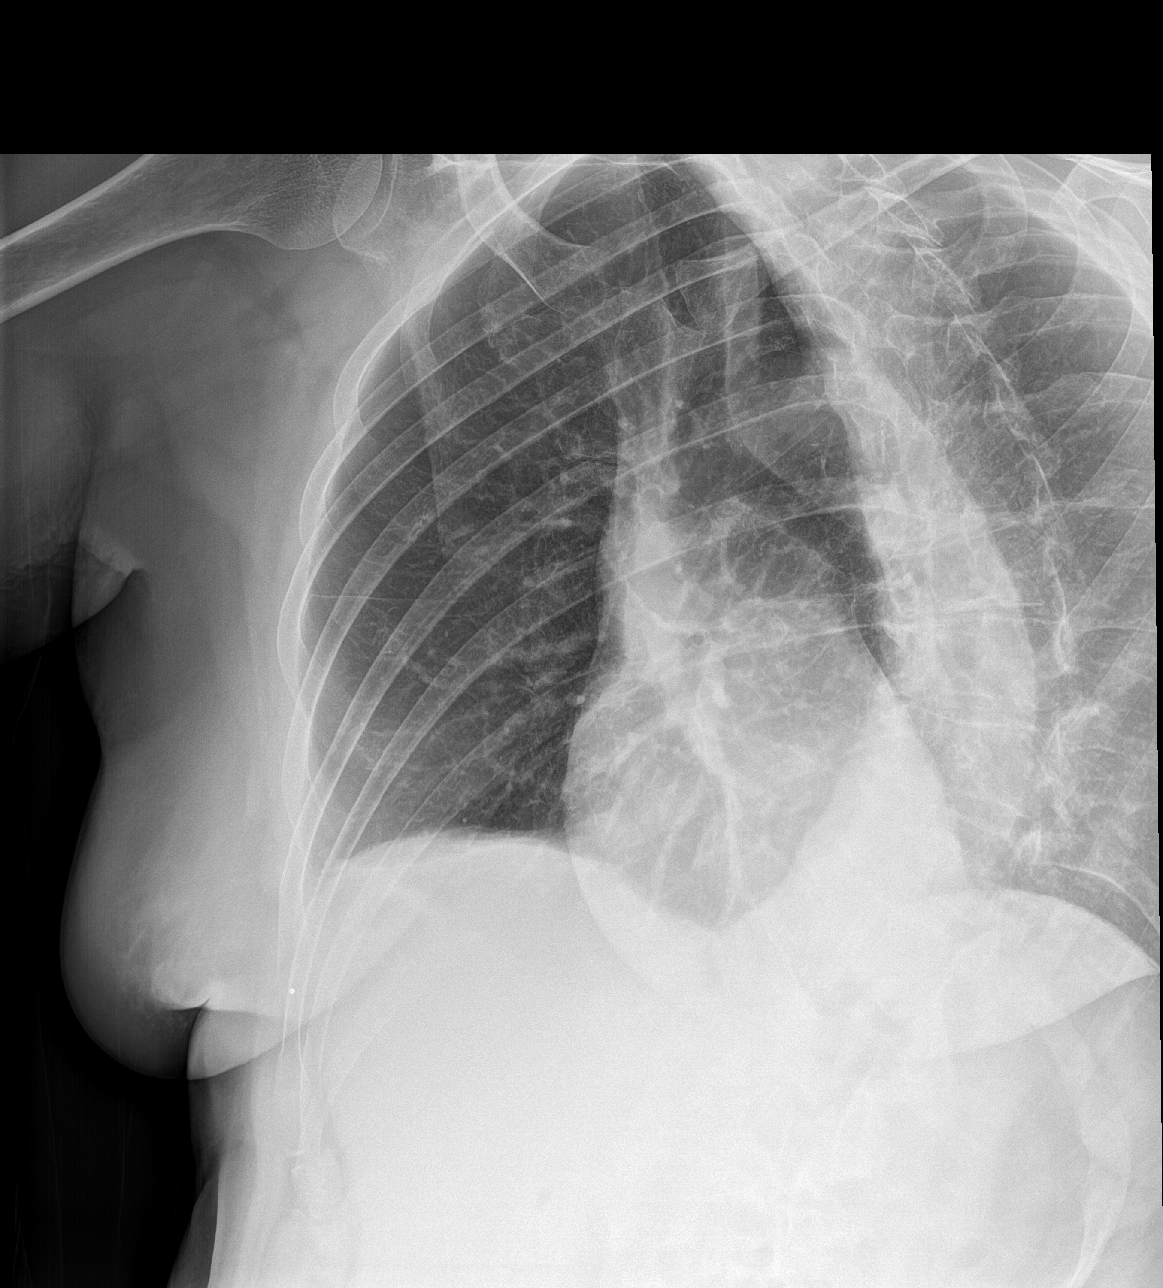

[3 of 3 positions shown; findings below may reference images not displayed]

FINDINGS: No fracture or other bone lesions are seen involving the ribs.
Thoracolumbar scoliosis. There is no evidence of pneumothorax or
pleural effusion. Both lungs are clear. Heart size and mediastinal
contours are within normal limits.
IMPRESSION: Negative.

## 2023-05-17 ENCOUNTER — Other Ambulatory Visit: Payer: Self-pay | Admitting: Orthopedic Surgery

## 2023-05-23 ENCOUNTER — Other Ambulatory Visit: Payer: Self-pay

## 2023-05-23 ENCOUNTER — Encounter (HOSPITAL_BASED_OUTPATIENT_CLINIC_OR_DEPARTMENT_OTHER): Payer: Self-pay | Admitting: Orthopedic Surgery

## 2023-05-23 NOTE — Progress Notes (Signed)
   05/23/23 1054  Pre-op Phone Call  Surgery Date Verified 05/31/23  Arrival Time Verified 1100  Surgery Location Verified [facility]  Medical History Reviewed Yes  Is the patient taking a GLP-1 receptor agonist? No  Does the patient have diabetes? No diagnosis of diabetes  Do you have a history of heart problems? No  Does patient have other implanted devices? Yes  Implanted Devices: (S)  Spinal cord stimulator  Does the patient have the ability to turn device off? Yes  Patient has been instructed to bring device remote, etc. on day of procedure. Yes  Patient Teaching Enhanced Recovery;Pre / Post Procedure  Patient educated about smoking cessation 24 hours prior to surgery. (S)  Yes (advisted pt to not smoke at least 24hrs before surgery)  Patient verbalizes understanding of bowel prep? N/A  THA/TKA patients only:  By your surgery date, will you have been taking narcotics for 90 days or greater? No  Med Rec Completed Yes  Take the Following Meds the Morning of Surgery pt doesnt take anything daily only as needed, instructed pt to stop all vitimans and supliments 7 days before surgery  Recent  Lab Work, EKG, CXR? No  NPO (Including gum & candy) After midnight  Allowed clear liquids Water;Gatorade  (diabetics please choose diet or no sugar options)  Patient instructed to stop clear liquids including Carb loading drink at: 0930  Stop Solids, Milk, Candy, and Gum STARTING AT MIDNIGHT  Did patient view EMMI videos? No  Responsible adult to drive and be with you for 24 hours? Yes  No Jewelry, money, nail polish or make-up.  No lotions, powders, perfumes. No shaving  48 hrs. prior to surgery. Yes  Contacts, Dentures & Glasses Will Have to be Removed Before OR. Yes  Please bring your ID and Insurance Card the morning of your surgery. (Surgery Centers Only) Yes  Bring any papers or x-rays with you that your surgeon gave you. Yes  Instructed to contact the location of procedure/ provider if they or  anyone in their household develops symptoms or tests positive for COVID-19, has close contact with someone who tests positive for COVID, or has known exposure to any contagious illness. Yes  Call this number the morning of surgery  with any problems that may cancel your surgery. [phone]  Covid-19 Assessment  Have you had a positive COVID-19 test within the previous 90 days? No  COVID Testing Guidance Proceed with the additional questions.  Patient's surgery required a COVID-19 test (cardiothoracic, complex ENT, and bronchoscopies/ EBUS) No  Have you been unmasked and in close contact with anyone with COVID-19 or COVID-19 symptoms within the past 10 days? No  Do you or anyone in your household currently have any COVID-19 symptoms? No

## 2023-05-28 NOTE — Progress Notes (Signed)

## 2023-05-31 ENCOUNTER — Ambulatory Visit (HOSPITAL_BASED_OUTPATIENT_CLINIC_OR_DEPARTMENT_OTHER)
Admission: RE | Admit: 2023-05-31 | Discharge: 2023-05-31 | Disposition: A | Discharge status: Home / Self Care | Payer: Medicare Other | Attending: Orthopedic Surgery | Admitting: Orthopedic Surgery

## 2023-05-31 ENCOUNTER — Encounter (HOSPITAL_BASED_OUTPATIENT_CLINIC_OR_DEPARTMENT_OTHER)
Admission: RE | Disposition: A | Discharge status: Home / Self Care | Payer: Self-pay | Source: Home / Self Care | Attending: Orthopedic Surgery

## 2023-05-31 ENCOUNTER — Ambulatory Visit (HOSPITAL_BASED_OUTPATIENT_CLINIC_OR_DEPARTMENT_OTHER): Payer: Medicare Other | Admitting: Anesthesiology

## 2023-05-31 ENCOUNTER — Encounter (HOSPITAL_BASED_OUTPATIENT_CLINIC_OR_DEPARTMENT_OTHER): Payer: Self-pay | Admitting: Orthopedic Surgery

## 2023-05-31 ENCOUNTER — Other Ambulatory Visit: Payer: Self-pay

## 2023-05-31 ENCOUNTER — Ambulatory Visit (HOSPITAL_BASED_OUTPATIENT_CLINIC_OR_DEPARTMENT_OTHER): Payer: Medicare Other

## 2023-05-31 DIAGNOSIS — M810 Age-related osteoporosis without current pathological fracture: Secondary | ICD-10-CM | POA: Diagnosis not present

## 2023-05-31 DIAGNOSIS — S5331XA Traumatic rupture of right ulnar collateral ligament, initial encounter: Secondary | ICD-10-CM

## 2023-05-31 DIAGNOSIS — S63682A Other sprain of left thumb, initial encounter: Secondary | ICD-10-CM | POA: Diagnosis present

## 2023-05-31 DIAGNOSIS — S63642A Sprain of metacarpophalangeal joint of left thumb, initial encounter: Secondary | ICD-10-CM

## 2023-05-31 DIAGNOSIS — E039 Hypothyroidism, unspecified: Secondary | ICD-10-CM | POA: Diagnosis not present

## 2023-05-31 DIAGNOSIS — G20A1 Parkinson's disease without dyskinesia, without mention of fluctuations: Secondary | ICD-10-CM | POA: Insufficient documentation

## 2023-05-31 DIAGNOSIS — Z87891 Personal history of nicotine dependence: Secondary | ICD-10-CM | POA: Diagnosis not present

## 2023-05-31 DIAGNOSIS — F1290 Cannabis use, unspecified, uncomplicated: Secondary | ICD-10-CM | POA: Insufficient documentation

## 2023-05-31 DIAGNOSIS — K219 Gastro-esophageal reflux disease without esophagitis: Secondary | ICD-10-CM | POA: Insufficient documentation

## 2023-05-31 DIAGNOSIS — X58XXXA Exposure to other specified factors, initial encounter: Secondary | ICD-10-CM | POA: Insufficient documentation

## 2023-05-31 HISTORY — PX: ULNAR COLLATERAL LIGAMENT REPAIR: SHX6159

## 2023-05-31 HISTORY — DX: Diaphragmatic hernia without obstruction or gangrene: K44.9

## 2023-05-31 HISTORY — DX: Age-related osteoporosis without current pathological fracture: M81.0

## 2023-05-31 HISTORY — DX: Parkinson's disease without dyskinesia, without mention of fluctuations: G20.A1

## 2023-05-31 SURGERY — REPAIR, LIGAMENT, ULNAR COLLATERAL
Anesthesia: Regional | Site: Thumb | Laterality: Left

## 2023-05-31 MED ORDER — DEXMEDETOMIDINE HCL IN NACL 80 MCG/20ML IV SOLN
INTRAVENOUS | Status: AC
  Filled 2023-05-31: qty 20

## 2023-05-31 MED ORDER — FENTANYL CITRATE (PF) 100 MCG/2ML IJ SOLN
100.0000 ug | Freq: Once | INTRAMUSCULAR | Status: AC
  Administered 2023-05-31: 100 ug via INTRAVENOUS

## 2023-05-31 MED ORDER — ROPIVACAINE HCL 5 MG/ML IJ SOLN
INTRAMUSCULAR | Status: DC | PRN
  Administered 2023-05-31: 35 mL via PERINEURAL

## 2023-05-31 MED ORDER — ACETAMINOPHEN 500 MG PO TABS
1000.0000 mg | ORAL_TABLET | Freq: Once | ORAL | Status: AC
  Administered 2023-05-31: 1000 mg via ORAL

## 2023-05-31 MED ORDER — ACETAMINOPHEN 500 MG PO TABS
ORAL_TABLET | ORAL | Status: AC
  Filled 2023-05-31: qty 2

## 2023-05-31 MED ORDER — EPHEDRINE 5 MG/ML INJ
INTRAVENOUS | Status: AC
  Filled 2023-05-31: qty 5

## 2023-05-31 MED ORDER — CLONIDINE HCL (ANALGESIA) 100 MCG/ML EP SOLN
EPIDURAL | Status: DC | PRN
  Administered 2023-05-31: 80 ug

## 2023-05-31 MED ORDER — EPHEDRINE SULFATE (PRESSORS) 50 MG/ML IJ SOLN
INTRAMUSCULAR | Status: DC | PRN
  Administered 2023-05-31 (×2): 10 mg via INTRAVENOUS

## 2023-05-31 MED ORDER — DEXAMETHASONE SODIUM PHOSPHATE 4 MG/ML IJ SOLN
INTRAMUSCULAR | Status: DC | PRN
  Administered 2023-05-31: 5 mg via PERINEURAL

## 2023-05-31 MED ORDER — ONDANSETRON HCL 4 MG/2ML IJ SOLN
INTRAMUSCULAR | Status: DC | PRN
  Administered 2023-05-31: 4 mg via INTRAVENOUS

## 2023-05-31 MED ORDER — FENTANYL CITRATE (PF) 100 MCG/2ML IJ SOLN: 25.0000 ug | INTRAMUSCULAR | Status: DC | PRN

## 2023-05-31 MED ORDER — LACTATED RINGERS IV SOLN: INTRAVENOUS | Status: DC

## 2023-05-31 MED ORDER — SODIUM CHLORIDE 0.9 % IV SOLN: INTRAVENOUS | Status: DC | PRN

## 2023-05-31 MED ORDER — DEXMEDETOMIDINE HCL IN NACL 80 MCG/20ML IV SOLN
INTRAVENOUS | Status: DC | PRN
  Administered 2023-05-31: 8 ug via INTRAVENOUS

## 2023-05-31 MED ORDER — CEFAZOLIN SODIUM-DEXTROSE 2-3 GM-%(50ML) IV SOLR
INTRAVENOUS | Status: DC | PRN
  Administered 2023-05-31: 2 g via INTRAVENOUS

## 2023-05-31 MED ORDER — PROPOFOL 500 MG/50ML IV EMUL
INTRAVENOUS | Status: DC | PRN
  Administered 2023-05-31: 125 ug/kg/min via INTRAVENOUS

## 2023-05-31 MED ORDER — MIDAZOLAM HCL 2 MG/2ML IJ SOLN
INTRAMUSCULAR | Status: AC
Start: 2023-05-31 — End: ?
  Filled 2023-05-31: qty 2

## 2023-05-31 MED ORDER — DROPERIDOL 2.5 MG/ML IJ SOLN: 0.6250 mg | Freq: Once | INTRAMUSCULAR | Status: DC | PRN

## 2023-05-31 MED ORDER — FENTANYL CITRATE (PF) 100 MCG/2ML IJ SOLN
INTRAMUSCULAR | Status: AC
  Filled 2023-05-31: qty 2

## 2023-05-31 MED ORDER — 0.9 % SODIUM CHLORIDE (POUR BTL) OPTIME
TOPICAL | Status: DC | PRN
  Administered 2023-05-31: 100 mL

## 2023-05-31 MED ORDER — ONDANSETRON HCL 4 MG/2ML IJ SOLN
INTRAMUSCULAR | Status: AC
  Filled 2023-05-31: qty 2

## 2023-05-31 SURGICAL SUPPLY — 69 items
ANCHOR JUGGERKNOT 1.0 1DR 2-0 (Anchor) IMPLANT
BLADE MINI RND TIP GREEN BEAV (BLADE) ×1 IMPLANT
BLADE SURG 15 STRL LF DISP TIS (BLADE) ×2 IMPLANT
BLADE SURG 15 STRL SS (BLADE) ×2
BNDG ELASTIC 2INX 5YD STR LF (GAUZE/BANDAGES/DRESSINGS) IMPLANT
BNDG ELASTIC 3INX 5YD STR LF (GAUZE/BANDAGES/DRESSINGS) IMPLANT
BNDG ESMARK 4X9 LF (GAUZE/BANDAGES/DRESSINGS) ×1 IMPLANT
BNDG GAUZE DERMACEA FLUFF 4 (GAUZE/BANDAGES/DRESSINGS) ×1 IMPLANT
CATH ROBINSON RED A/P 8FR (CATHETERS) IMPLANT
CHLORAPREP W/TINT 26 (MISCELLANEOUS) ×1 IMPLANT
CORD BIPOLAR FORCEPS 12FT (ELECTRODE) ×1 IMPLANT
COVER BACK TABLE 60X90IN (DRAPES) ×1 IMPLANT
COVER MAYO STAND STRL (DRAPES) ×1 IMPLANT
CUFF TOURN SGL QUICK 18X4 (TOURNIQUET CUFF) ×1 IMPLANT
DRAPE EXTREMITY T 121X128X90 (DISPOSABLE) ×1 IMPLANT
DRAPE OEC MINIVIEW 54X84 (DRAPES) IMPLANT
DRAPE SURG 17X23 STRL (DRAPES) ×1 IMPLANT
GAUZE PAD ABD 8X10 STRL (GAUZE/BANDAGES/DRESSINGS) IMPLANT
GAUZE SPONGE 4X4 12PLY STRL (GAUZE/BANDAGES/DRESSINGS) ×1 IMPLANT
GAUZE XEROFORM 1X8 LF (GAUZE/BANDAGES/DRESSINGS) ×1 IMPLANT
GLOVE BIO SURGEON STRL SZ7.5 (GLOVE) ×1 IMPLANT
GLOVE BIOGEL PI IND STRL 8 (GLOVE) ×1 IMPLANT
GLOVE BIOGEL PI IND STRL 8.5 (GLOVE) ×1 IMPLANT
GLOVE SURG ORTHO 8.0 STRL STRW (GLOVE) IMPLANT
GOWN STRL REUS W/ TWL LRG LVL3 (GOWN DISPOSABLE) ×1 IMPLANT
GOWN STRL REUS W/TWL LRG LVL3 (GOWN DISPOSABLE) ×1
GOWN STRL REUS W/TWL XL LVL3 (GOWN DISPOSABLE) ×1 IMPLANT
K-WIRE DBL .035X4 NSTRL (WIRE)
KWIRE DBL .035X4 NSTRL (WIRE) IMPLANT
NDL HYPO 22X1.5 SAFETY MO (MISCELLANEOUS) IMPLANT
NDL HYPO 25X1 1.5 SAFETY (NEEDLE) IMPLANT
NDL KEITH (NEEDLE) IMPLANT
NDL SAFETY ECLIPSE 18X1.5 (NEEDLE) IMPLANT
NEEDLE HYPO 22X1.5 SAFETY MO (MISCELLANEOUS) IMPLANT
NEEDLE HYPO 25X1 1.5 SAFETY (NEEDLE) IMPLANT
NEEDLE KEITH (NEEDLE) IMPLANT
NS IRRIG 1000ML POUR BTL (IV SOLUTION) ×1 IMPLANT
PACK BASIN DAY SURGERY FS (CUSTOM PROCEDURE TRAY) ×1 IMPLANT
PAD CAST 3X4 CTTN HI CHSV (CAST SUPPLIES) ×1 IMPLANT
PAD CAST 4YDX4 CTTN HI CHSV (CAST SUPPLIES) IMPLANT
PADDING CAST ABS COTTON 4X4 ST (CAST SUPPLIES) ×1 IMPLANT
PADDING CAST COTTON 3X4 STRL (CAST SUPPLIES) ×1
PADDING CAST COTTON 4X4 STRL (CAST SUPPLIES)
PASSER SUT SWANSON 36MM LOOP (INSTRUMENTS) IMPLANT
SLEEVE SCD COMPRESS KNEE MED (STOCKING) ×1 IMPLANT
SLING ARM FOAM STRAP LRG (SOFTGOODS) IMPLANT
SPIKE FLUID TRANSFER (MISCELLANEOUS) IMPLANT
SPLINT PLASTER CAST FAST 5X30 (CAST SUPPLIES) IMPLANT
SPLINT PLASTER CAST XFAST 3X15 (CAST SUPPLIES) IMPLANT
STOCKINETTE 4X48 STRL (DRAPES) ×1 IMPLANT
SUT CHROMIC 4 0 P 3 18 (SUTURE) IMPLANT
SUT ETHIBOND 3-0 V-5 (SUTURE) IMPLANT
SUT ETHILON 3 0 PS 1 (SUTURE) IMPLANT
SUT ETHILON 4 0 PS 2 18 (SUTURE) IMPLANT
SUT FIBERWIRE 2-0 18 17.9 3/8 (SUTURE)
SUT FIBERWIRE 3-0 18 TAPR NDL (SUTURE)
SUT MERSILENE 2.0 SH NDLE (SUTURE) IMPLANT
SUT MERSILENE 4 0 P 3 (SUTURE) IMPLANT
SUT SILK 4 0 PS 2 (SUTURE) IMPLANT
SUT VIC AB 2-0 SH 27 (SUTURE)
SUT VIC AB 2-0 SH 27XBRD (SUTURE) IMPLANT
SUT VIC AB 4-0 PS2 18 (SUTURE) IMPLANT
SUT VICRYL 0 SH 27 (SUTURE) IMPLANT
SUTURE FIBERWR 2-0 18 17.9 3/8 (SUTURE) IMPLANT
SUTURE FIBERWR 3-0 18 TAPR NDL (SUTURE) IMPLANT
SYR BULB EAR ULCER 3OZ GRN STR (SYRINGE) ×1 IMPLANT
SYR CONTROL 10ML LL (SYRINGE) IMPLANT
TOWEL GREEN STERILE FF (TOWEL DISPOSABLE) ×2 IMPLANT
UNDERPAD 30X36 HEAVY ABSORB (UNDERPADS AND DIAPERS) ×1 IMPLANT

## 2023-05-31 NOTE — Transfer of Care (Signed)
Immediate Anesthesia Transfer of Care Note  Patient: Diane Bryant  Procedure(s) Performed: left thumb ulnar collateral ligament repair (Left: Thumb)  Patient Location: PACU  Anesthesia Type:MAC  Level of Consciousness: awake, alert , and oriented  Airway & Oxygen Therapy: Patient Spontanous Breathing and Patient connected to face mask oxygen  Post-op Assessment: Report given to RN and Post -op Vital signs reviewed and stable  Post vital signs: Reviewed and stable  Last Vitals:  Vitals Value Taken Time  BP    Temp    Pulse 87 05/31/23 1252  Resp 18 05/31/23 1252  SpO2 95 % 05/31/23 1252  Vitals shown include unfiled device data.  Last Pain:  Vitals:   05/31/23 1002  TempSrc: Oral  PainSc: 0-No pain      Patients Stated Pain Goal: 5 (05/31/23 1002)  Complications: No notable events documented.

## 2023-05-31 NOTE — Progress Notes (Signed)
Assisted Dr. Germeroth with left, axillary, ultrasound guided block. Side rails up, monitors on throughout procedure. See vital signs in flow sheet. Tolerated Procedure well. 

## 2023-05-31 NOTE — H&P (Signed)
Diane Bryant is an 67 y.o. female.   Chief Complaint: collateral ligament tear HPI: 67 yo female with left thumb collateral ligament tear.  Confirmed on MRI.  It has continued to be painful despite splinting.  She wishes to proceed with operative collateral ligament repair vs reconstruction.  Allergies:  Allergies  Allergen Reactions   Carbidopa-Levodopa Shortness Of Breath    Felt Winded , sluggish,    Cortisone     Cortisone eye drops nausea and headache   Gabapentin Anxiety    Tight chest,no appetite,shakey   Amantadines     Flu-Like Symptoms   Bee Venom    Covid-19 (Mrna) Vaccine     Abdominal Pain   Other Diarrhea and Nausea And Vomiting    Diane Bryant   Prednisone     Pt sts that she had left facial numbness    Penicillins Swelling    Arm swelling at injection site as a child   Percocet [Oxycodone-Acetaminophen] Other (See Comments)    Felt like face was drooping    Past Medical History:  Diagnosis Date   Allergy    Anxiety    Arthritis    Benign essential tremor    CMC arthritis    right thumb   Depression    GERD (gastroesophageal reflux disease)    OTC meds prn   Heart murmur    when pregnant-echo in quate-nothing showed up   Hiatal hernia    History of pneumonia    Hypotension    Low BP    Neuromuscular disorder (HCC)    Osteoporosis    Parkinson disease (HCC)    PUD (peptic ulcer disease)    Wears glasses     Past Surgical History:  Procedure Laterality Date   CARPOMETACARPEL SUSPENSION PLASTY Right 02/13/2017   Procedure: EXCISION TRAPEZIUM ABDUCTOR POLLICIS LONGUS TRANSFER RIGHT THUMB;  Surgeon: Diane Salt, MD;  Location: [facility];  Service: Orthopedics;  Laterality: Right;   EXCISION METACARPAL MASS Right 05/01/2013   Procedure: EXCISIONAL BIOPSY OF RIGHT THUMB;  Surgeon: Diane Bryant., MD;  Location: [facility];  Service: Orthopedics;  Laterality: Right;   HERNIA REPAIR  1990   bilat ing   PR SURG  IMPLNT NEUROELECT,EPIDURAL  04/29/2021   LAMINECTOMY FOR IMPLANTATION OF NEUROSTIMULATOR ELECTRODES PLATE/PADDLE, EPIDURAL; Surgeon: Diane Council, MD; Location: MAIN OR [facility]; Service: Neurosurgery  Medical devices from this surgery are in the Medical Devices section.   TONGUE SURGERY     Pt has had 2 Tongue surgeries   TUBAL LIGATION     WISDOM TOOTH EXTRACTION      Family History: Family History  Problem Relation Age of Onset   Allergic rhinitis Neg Hx    Angioedema Neg Hx    Asthma Neg Hx    Atopy Neg Hx    Eczema Neg Hx    Immunodeficiency Neg Hx    Urticaria Neg Hx     Social History:   reports that she quit smoking about 50 years ago. Her smoking use included cigarettes. She has never used smokeless tobacco. She reports current drug use. Drug: Marijuana. She reports that she does not drink alcohol.  Medications: Medications Prior to Admission  Medication Sig Dispense Refill   ALPRAZolam (XANAX) 1 MG tablet Take 0.5-1 mg by mouth daily as needed.     clonazePAM (KLONOPIN) 0.5 MG tablet Take 0.5 mg by mouth 2 (two) times daily as needed for anxiety.     cyclobenzaprine (FLEXERIL) 10  MG tablet Take 10 mg by mouth 3 (three) times daily as needed for muscle spasms.     Omega-3 Fatty Acids (FISH OIL) 1000 MG CAPS Take by mouth.     omeprazole (PRILOSEC) 10 MG capsule Take 10 mg by mouth daily.     progesterone (PROMETRIUM) 100 MG capsule Take 100 mg by mouth daily.      No results found for this or any previous visit (from the past 48 hour(s)).  No results found.    Blood pressure 121/70, pulse 72, temperature 98.2 F (36.8 C), temperature source Oral, resp. rate 18, height 5\' 5"  (1.651 m), weight 80.4 kg, SpO2 96%.  General appearance: alert, cooperative, and appears stated age Head: Normocephalic, without obvious abnormality, atraumatic Neck: supple, symmetrical, trachea midline Extremities: Intact sensation and capillary refill all digits.  +epl/fpl/io.  No wounds.   Pulses: 2+ and symmetric Skin: Skin color, texture, turgor normal. No rashes or lesions Neurologic: Grossly normal Incision/Wound: none  Assessment/Plan Left thumb ulnar collateral ligament tear.  Non operative and operative treatment options have been discussed with the patient and patient wishes to proceed with operative treatment. Risks, benefits, and alternatives of surgery have been discussed and the patient agrees with the plan of care.   Diane Bryant 05/31/2023, 10:24 AM

## 2023-05-31 NOTE — Anesthesia Preprocedure Evaluation (Addendum)
Anesthesia Evaluation  Patient identified by MRN, date of birth, ID band Patient awake    Reviewed: Allergy & Precautions, NPO status , Patient's Chart, lab work & pertinent test results  Airway Mallampati: II  TM Distance: >3 FB Neck ROM: Full    Dental  (+) Teeth Intact, Dental Advisory Given, Caps,    Pulmonary former smoker   Pulmonary exam normal breath sounds clear to auscultation       Cardiovascular + Valvular Problems/Murmurs  Rhythm:Regular Rate:Normal + Systolic murmurs    Neuro/Psych  PSYCHIATRIC DISORDERS Anxiety Depression    Essential tremor     GI/Hepatic Neg liver ROS, hiatal hernia, PUD,GERD  Medicated,,  Endo/Other  Hypothyroidism    Renal/GU negative Renal ROS     Musculoskeletal  (+) Arthritis , Osteoarthritis,    Abdominal   Peds  Hematology negative hematology ROS (+)   Anesthesia Other Findings Day of surgery medications reviewed with the patient.  Reproductive/Obstetrics                             Anesthesia Physical Anesthesia Plan  ASA: 2  Anesthesia Plan: Regional   Post-op Pain Management:  Regional for Post-op pain and Tylenol PO (pre-op)* and Regional block*   Induction: Intravenous  PONV Risk Score and Plan: 2 and Ondansetron, Dexamethasone, Midazolam, Propofol infusion, Treatment may vary due to age or medical condition and TIVA  Airway Management Planned: Natural Airway  Additional Equipment:   Intra-op Plan:   Post-operative Plan:   Informed Consent: I have reviewed the patients History and Physical, chart, labs and discussed the procedure including the risks, benefits and alternatives for the proposed anesthesia with the patient or authorized representative who has indicated his/her understanding and acceptance.     Dental advisory given  Plan Discussed with: CRNA  Anesthesia Plan Comments: (Risks of anesthesia explained at length.  This includes, but is not limited to, sore throat, damage to teeth, lips gums, tongue and vocal cords, nausea and vomiting, reactions to medications, stroke, heart attack, and death. All patient questions were answered and the patient wishes to proceed.   Risks of peripheral nerve block explained at length. This includes, but is not limited to, bleeding, infection, reactions to the medications, seizures, damage to surrounding structures, damage to nerves, permanent weakness, numbness, tingling and pain. All patient questions were answered and patient wishes to proceed with nerve block. )        Anesthesia Quick Evaluation

## 2023-05-31 NOTE — Anesthesia Postprocedure Evaluation (Signed)
Anesthesia Post Note  Patient: Diane Bryant  Procedure(s) Performed: left thumb ulnar collateral ligament repair (Left: Thumb)     Patient location during evaluation: PACU Anesthesia Type: Regional Level of consciousness: awake and alert Pain management: pain level controlled Vital Signs Assessment: post-procedure vital signs reviewed and stable Respiratory status: spontaneous breathing Cardiovascular status: stable Anesthetic complications: no   No notable events documented.  Last Vitals:  Vitals:   05/31/23 1300 05/31/23 1311  BP: 99/65 (!) 107/53  Pulse: 82 77  Resp: 15 18  Temp:  (!) 36.2 C  SpO2: 96% 94%    Last Pain:  Vitals:   05/31/23 1311  TempSrc:   PainSc: 0-No pain                 Lewie Loron

## 2023-05-31 NOTE — Anesthesia Procedure Notes (Signed)
Procedure Name: MAC Date/Time: 05/31/2023 11:49 AM  Performed by: Cleda Clarks, CRNAPre-anesthesia Checklist: Patient identified, Emergency Drugs available, Suction available, Patient being monitored and Timeout performed Patient Re-evaluated:Patient Re-evaluated prior to induction Oxygen Delivery Method: Simple face mask Placement Confirmation: positive ETCO2

## 2023-05-31 NOTE — Discharge Instructions (Addendum)
[facility] Center Instructions Hand Surgery  Wound Care: Keep your hand elevated above the level of your heart.  Do not allow it to dangle by your side.  Keep the dressing dry and do not remove it unless your doctor advises you to do so.  He will usually change it at the time of your post-op visit.  Moving your fingers is advised to stimulate circulation but will depend on the site of your surgery.  If you have a splint applied, your doctor will advise you regarding movement.  Activity: Do not drive or operate machinery today.  Rest today and then you may return to your normal activity and work as indicated by your physician.  Diet:  Drink liquids today or eat a light diet.  You may resume a regular diet tomorrow.    General expectations: Pain for two to three days. Fingers may become slightly swollen.  Call your doctor if any of the following occur: Severe pain not relieved by pain medication. Elevated temperature. Dressing soaked with blood. Inability to move fingers. White or bluish color to fingers.   May take Tylenol after 4:30 pm, if needed.    Post Anesthesia Home Care Instructions  Activity: Get plenty of rest for the remainder of the day. A responsible individual must stay with you for 24 hours following the procedure.  For the next 24 hours, DO NOT: -Drive a car -Advertising copywriter -Drink alcoholic beverages -Take any medication unless instructed by your physician -Make any legal decisions or sign important papers.  Meals: Start with liquid foods such as gelatin or soup. Progress to regular foods as tolerated. Avoid greasy, spicy, heavy foods. If nausea and/or vomiting occur, drink only clear liquids until the nausea and/or vomiting subsides. Call your physician if vomiting continues.  Special Instructions/Symptoms: Your throat may feel dry or sore from the anesthesia or the breathing tube placed in your throat during surgery. If this causes discomfort, gargle with warm  salt water. The discomfort should disappear within 24 hours.  If you had a scopolamine patch placed behind your ear for the management of post- operative nausea and/or vomiting:  1. The medication in the patch is effective for 72 hours, after which it should be removed.  Wrap patch in a tissue and discard in the trash. Wash hands thoroughly with soap and water. 2. You may remove the patch earlier than 72 hours if you experience unpleasant side effects which may include dry mouth, dizziness or visual disturbances. 3. Avoid touching the patch. Wash your hands with soap and water after contact with the patch.    Post Anesthesia Home Care Instructions  Activity: Get plenty of rest for the remainder of the day. A responsible individual must stay with you for 24 hours following the procedure.  For the next 24 hours, DO NOT: -Drive a car -Advertising copywriter -Drink alcoholic beverages -Take any medication unless instructed by your physician -Make any legal decisions or sign important papers.  Meals: Start with liquid foods such as gelatin or soup. Progress to regular foods as tolerated. Avoid greasy, spicy, heavy foods. If nausea and/or vomiting occur, drink only clear liquids until the nausea and/or vomiting subsides. Call your physician if vomiting continues.  Special Instructions/Symptoms: Your throat may feel dry or sore from the anesthesia or the breathing tube placed in your throat during surgery. If this causes discomfort, gargle with warm salt water. The discomfort should disappear within 24 hours.  If you had a scopolamine patch placed behind your  ear for the management of post- operative nausea and/or vomiting:  1. The medication in the patch is effective for 72 hours, after which it should be removed.  Wrap patch in a tissue and discard in the trash. Wash hands thoroughly with soap and water. 2. You may remove the patch earlier than 72 hours if you experience unpleasant side effects  which may include dry mouth, dizziness or visual disturbances. 3. Avoid touching the patch. Wash your hands with soap and water after contact with the patch.   Regional Anesthesia Blocks  1. You may not be able to move or feel the "blocked" extremity after a regional anesthetic block. This may last may last from 3-48 hours after placement, but it will go away. The length of time depends on the medication injected and your individual response to the medication. As the nerves start to wake up, you may experience tingling as the movement and feeling returns to your extremity. If the numbness and inability to move your extremity has not gone away after 48 hours, please call your surgeon.   2. The extremity that is blocked will need to be protected until the numbness is gone and the strength has returned. Because you cannot feel it, you will need to take extra care to avoid injury. Because it may be weak, you may have difficulty moving it or using it. You may not know what position it is in without looking at it while the block is in effect.  3. For blocks in the legs and feet, returning to weight bearing and walking needs to be done carefully. You will need to wait until the numbness is entirely gone and the strength has returned. You should be able to move your leg and foot normally before you try and bear weight or walk. You will need someone to be with you when you first try to ensure you do not fall and possibly risk injury.  4. Bruising and tenderness at the needle site are common side effects and will resolve in a few days.  5. Persistent numbness or new problems with movement should be communicated to the surgeon or the [facility] [phone] [facility] [phone]).

## 2023-05-31 NOTE — Op Note (Signed)
NAME: Diane Bryant MEDICAL RECORD NO: [id number] DATE OF BIRTH: Oct 26, 1955 FACILITY: [facility] LOCATION: [facility] PHYSICIAN: Tami Ribas, MD   OPERATIVE REPORT   DATE OF PROCEDURE: 05/31/23    PREOPERATIVE DIAGNOSIS: Left thumb ulnar collateral ligament tear   POSTOPERATIVE DIAGNOSIS: Left thumb ulnar collateral ligament tear   PROCEDURE: Left thumb ulnar collateral ligament repair   SURGEON:  Betha Loa, M.D.   ASSISTANT: Cindee Salt, MD   ANESTHESIA:  Regional with sedation   INTRAVENOUS FLUIDS:  Per anesthesia flow sheet.   ESTIMATED BLOOD LOSS:  Minimal.   COMPLICATIONS:  None.   SPECIMENS:  none   TOURNIQUET TIME:    Total Tourniquet Time Documented: Upper Arm (Left) - 35 minutes Total: Upper Arm (Left) - 35 minutes    DISPOSITION:  Stable to PACU.   INDICATIONS: 67 year old female with left thumb ulnar collateral ligament tear.  She is tried splinting without adequate relief.  This continues to be painful.  She wishes to proceed with left thumb ulnar collateral ligament repair versus reconstruction.  Risks, benefits and alternatives of surgery were discussed including the risks of blood loss, infection, damage to nerves, vessels, tendons, ligaments, bone for surgery, need for additional surgery, complications with wound healing, continued pain, stiffness.  She voiced understanding of these risks and elected to proceed.  OPERATIVE COURSE:  After being identified preoperatively by myself,  the patient and I agreed on the procedure and site of the procedure.  The surgical site was marked.  Surgical consent had been signed. Preoperative IV antibiotic prophylaxis was given. She was transferred to the operating room and placed on the operating table in supine position with the Left upper extremity on an arm board.  Sedation was induced by the anesthesiologist. A regional block had been performed by anesthesia in preoperative holding.    Left upper  extremity was prepped and draped in normal sterile orthopedic fashion.  A surgical pause was performed between the surgeons, anesthesia, and operating room staff and all were in agreement as to the patient, procedure, and site of procedure.  Tourniquet at the proximal aspect of the forearm was inflated to 250 mmHg after exsanguination of the arm with an Esmarch bandage.  Incision was made on the dorsum of the thumb over the MP joint.  This was carried in subcutaneous tissues by spreading technique.  Bipolar electrocautery is used to obtain hemostasis.  The adductor aponeurosis was identified and sharply incised.  The capsule was sharply incised.  The collateral ligament was identified.  It was partly peeled off from the base of the proximal phalanx.  The remaining attached fibers were released.  The quality of the ligament was adequate.  The guidepin for the juggernaut suture anchor was used to make a hole in the anchor placed.  The anchor pulled out.  The hole was enlarged.  The anchor was removed and the suture was then passed through the proximal phalanx dorsally and out through the footprint of the collateral ligament.  This was then passed through the collateral ligament and used to reapproximate the collateral ligament down to the footprint.  Good reapproximation was obtained.  There was stability to the thumb MP joint with stressing of the collateral ligament after repair.  The wound was copiously irrigated with sterile saline.  The capsule was repaired with a 4-0 chromic suture.  The adductor aponeurosis was repaired with a 4-0 Mersilene suture in a running fashion.  The skin was closed with 4-0 nylon  in a horizontal mattress fashion.  The wound was dressed with sterile Xeroform 4 x 4's and wrapped with a Kerlix bandage.  Thumb spica splint was placed and wrapped with Kerlix and Ace bandage.  The tourniquet was deflated at 35 minutes.  Fingertips were pink with brisk capillary refill after deflation of  tourniquet.  The operative  drapes were broken down.  The patient was awoken from anesthesia safely.  She was transferred back to the stretcher and taken to PACU in stable condition.  I will see her back in the office in 1 week for postoperative followup.  She states she is unable to take pain medications and wants to use ibuprofen only.  Betha Loa, MD Electronically signed, 05/31/23

## 2023-05-31 NOTE — Op Note (Signed)
I assisted Surgeons and Role:    Betha Loa, MD - Primary on the Procedure(s): left thumb ulnar collateral ligament repair on 05/31/2023.  I provided assistance on this case as follows: Set up, approach, entrance into the joint, identification of the collateral ligament injury, placement of the tear for repair through drill holes, repair of the collateral ligament, closure of the wound and application of the dressing and splint.  Electronically signed by: Cindee Salt, MD Date: 05/31/2023 Time: 12:46 PM

## 2023-05-31 NOTE — Anesthesia Procedure Notes (Signed)
Anesthesia Regional Block: Axillary brachial plexus block   Pre-Anesthetic Checklist: , timeout performed,  Correct Patient, Correct Site, Correct Laterality,  Correct Procedure, Correct Position, site marked,  Risks and benefits discussed,  Surgical consent,  Pre-op evaluation,  At surgeon's request and post-op pain management  Laterality: Upper and Left  Prep: chloraprep       Needles:  Injection technique: Single-shot  Needle Type: Stimiplex          Additional Needles:   Procedures:,,,, ultrasound used (permanent image in chart),,    Narrative:  Start time: 05/31/2023 10:37 AM End time: 05/31/2023 10:57 AM Injection made incrementally with aspirations every 5 mL.  Performed by: Personally  Anesthesiologist: Diane Loron, MD  Additional Notes: BP cuff, SpO2 and EKG monitors applied. Sedation begun. Nerve location verified with ultrasound. Anesthetic injected incrementally, slowly, and after neg aspirations under direct u/s guidance. Good perineural spread. Tolerated well.

## 2023-06-01 ENCOUNTER — Encounter (HOSPITAL_BASED_OUTPATIENT_CLINIC_OR_DEPARTMENT_OTHER): Payer: Self-pay | Admitting: Orthopedic Surgery

## 2023-07-23 ENCOUNTER — Ambulatory Visit (INDEPENDENT_AMBULATORY_CARE_PROVIDER_SITE_OTHER): Payer: Medicare Other | Admitting: Nurse Practitioner

## 2023-07-23 VITALS — BP 120/74 | HR 81 | Ht 64.75 in | Wt 180.1 lb

## 2023-07-23 DIAGNOSIS — R0789 Other chest pain: Secondary | ICD-10-CM | POA: Diagnosis not present

## 2023-07-23 DIAGNOSIS — K2100 Gastro-esophageal reflux disease with esophagitis, without bleeding: Secondary | ICD-10-CM | POA: Diagnosis not present

## 2023-07-23 DIAGNOSIS — R1310 Dysphagia, unspecified: Secondary | ICD-10-CM

## 2023-07-23 DIAGNOSIS — Z8619 Personal history of other infectious and parasitic diseases: Secondary | ICD-10-CM | POA: Diagnosis not present

## 2023-07-23 DIAGNOSIS — R1013 Epigastric pain: Secondary | ICD-10-CM

## 2023-07-23 DIAGNOSIS — R6881 Early satiety: Secondary | ICD-10-CM

## 2023-07-23 MED ORDER — FAMOTIDINE 40 MG PO TABS: 40.0000 mg | ORAL_TABLET | Freq: Every day | ORAL | 5 refills | Status: AC

## 2023-07-23 NOTE — Patient Instructions (Addendum)
 _______________________________________________________  If your blood pressure at your visit was 140/90 or greater, please contact your primary care physician to follow up on this. _______________________________________________________  If you are age 68 or older, your body mass index should be between 23-30. Your Body mass index is 30.21 kg/m. If this is out of the aforementioned range listed, please consider follow up with your Primary Care Provider. ________________________________________________________  The [facility] providers would like to encourage you to use MYCHART to communicate with providers for non-urgent requests or questions.  Due to long hold times on the telephone, sending your provider a message by [facility] may be a faster and more efficient way to get a response.  Please allow 48 business hours for a response.  Please remember that this is for non-urgent requests.  _______________________________________________________  We have sent the following medications to your pharmacy for you to pick up at your convenience:  START: Pepcid  40mg  take one every morning  Please call us  in 2 weeks and speak with Merlynn, RN with an update on how you are feeling.  Thank you for entrusting me with your care and choosing [facility].  Vina Dasen, NP

## 2023-07-23 NOTE — Progress Notes (Signed)
 Brief Narrative 68 y.o. yo female previously followed by Dr. Eda with a past medical history not limited to H. pylori infection, Parkinson's disease, anxiety .SABRA Patient referred by PCP for history of H.pylori infection     ASSESSMENT    GERD with history of reflux esophagitis  ( biopsies 2021)  Having postprandial epigastric burning / chest burning, regurgitation off reflux medications. Suspect symptoms secondary to untreated GERD  History of H.pylori infection in 2021 ( on biopsy). Completed treatment followed by eradication testing.  A repeat H.pylori breath test by PCP August 2024 was negative.   Negative Cologuard June 2023   See PMH for any additional medical history   PLAN    --Didn't tolerate Omeprazole , felt it caused bloating. Will try different class of medication. Trial of Famotidine  40 mg daily  --Discussed anti-reflux measures such as avoidance of late meals / bedtime snacks, HOB elevation (or use of wedge pillow), weight reduction ( if applicable)  / maintaining a healthy BMI ( body mass index),  and avoidance of trigger foods and caffeine.  --Asked her to contact us  in a couple of weeks with a condition update. If not improved consider EGD.   HPI   Chief complaint : epigastric pain, reflux  Brief GI History: Patient last seen in 2021 for epigastric pain and reflux symptoms She underwent EGD with was normal except for a medium size hiatal hernia. Gastric biopsies positive for H.pylori which was treated with antibiotics. Follow up breath test negative.   Interval History:  Diane Bryant is here with several GI concerns. She regurgitates if she bends forward. She has epigastric and chest burning after almost anything she eats. She has episodes of coughing which have sometimes triggered vomiting. She is concerned that her esophagus make be leaking contents into her chest . She also wonders if some of her symptoms are due to a food allergy.. She took Omeprazole  a few times  in August but it caused bloating so she stopped it. She has noticed that flexeril  helps calm her GI symptoms.    Tahirih has no lower GI complaints. No blood in stool. She had a negative Cologuard in Jan 2023. Doesn't get colonoscopy due to prep intolerance.    PCP Labs 09/11/22 ( Care Everywhere) Hgb 14.2 CMP normal. TSH normal at 1.5  GI History / Studies   **May not be a complete list of studies   June 2021 EGD for epigastric pain and suspected reflux --Medium size hiatal hernia, otherwise normal  Biopsies taken from stomach Diagnosis 1. Surgical [P], duodenal - DUODENAL MUCOSA WITH NO SPECIFIC HISTOPATHOLOGIC CHANGES - NEGATIVE FOR INCREASED INTRAEPITHELIAL LYMPHOCYTES OR VILLOUS ARCHITECTURAL CHANGES 2. Surgical [P], fundus, gastric antrum and gastric body - GASTRIC ANTRAL AND OXYNTIC MUCOSA WITH HELICOBACTER PYLORI-ASSOCIATED GASTRITIS (CONFIRMED WITH WARTHIN STARRY STAIN) 3. Surgical [P], distal esophagus - ESOPHAGEAL SQUAMOUS MUCOSA WITH VASCULAR CONGESTION, AND SQUAMOUS BALLOONING, SUGGESTIVE OF REFLUX ESOPHAGITIS - NEGATIVE FOR INCREASED INTRAEPITHELIAL EOSINOPHILS 4. Surgical [P], mid and proximal esophagus - ESOPHAGEAL SQUAMOUS MUCOSA WITH MILD VASCULAR CONGESTION, AND FOCAL SQUAMOUS BALLOONING, SUGGESTIVE OF REFLUX ESOPHAGITIS - NEGATIVE FOR INCREASED INTRAEPITHELIAL EOSINOPHILS   Labs      Latest Ref Rng & Units 06/08/2019    5:41 PM 05/01/2013    7:07 AM 02/02/2010   11:04 AM  CBC  WBC 4.0 - 10.5 K/uL 13.7   17.1   Hemoglobin 12.0 - 15.0 g/dL 86.5  84.9  85.8   Hematocrit 36.0 - 46.0 % 41.4  42.0   Platelets 150 - 400 K/uL 205   232     Lab Results  Component Value Date   LIPASE 25 06/08/2019      Latest Ref Rng & Units 06/08/2019    5:41 PM  CMP  Glucose 70 - 99 mg/dL 98   BUN 8 - 23 mg/dL 9   Creatinine 9.55 - 8.99 mg/dL 9.33   Sodium 864 - 854 mmol/L 140   Potassium 3.5 - 5.1 mmol/L 3.6   Chloride 98 - 111 mmol/L 112   CO2 22 - 32 mmol/L 21    Calcium 8.9 - 10.3 mg/dL 8.7   Total Protein 6.5 - 8.1 g/dL 6.5   Total Bilirubin 0.3 - 1.2 mg/dL 0.6   Alkaline Phos 38 - 126 U/L 79   AST 15 - 41 U/L 14   ALT 0 - 44 U/L 16      Past Medical History:  Diagnosis Date   Allergy    Anxiety    Arthritis    Benign essential tremor    CMC arthritis    right thumb   Depression    GERD (gastroesophageal reflux disease)    OTC meds prn   Heart murmur    when pregnant-echo in quate-nothing showed up   Hiatal hernia    History of pneumonia    Hypotension    Low BP    Neuromuscular disorder (HCC)    Osteoporosis    Parkinson disease (HCC)    PUD (peptic ulcer disease)    Wears glasses    Past Surgical History:  Procedure Laterality Date   CARPOMETACARPEL SUSPENSION PLASTY Right 02/13/2017   Procedure: EXCISION TRAPEZIUM ABDUCTOR POLLICIS LONGUS TRANSFER RIGHT THUMB;  Surgeon: Murrell Kuba, MD;  Location: [facility];  Service: Orthopedics;  Laterality: Right;   EXCISION METACARPAL MASS Right 05/01/2013   Procedure: EXCISIONAL BIOPSY OF RIGHT THUMB;  Surgeon: Lamar LULLA Leonor Mickey., MD;  Location: [facility];  Service: Orthopedics;  Laterality: Right;   HERNIA REPAIR  1990   bilat ing   PR SURG IMPLNT NEUROELECT,EPIDURAL  04/29/2021   LAMINECTOMY FOR IMPLANTATION OF NEUROSTIMULATOR ELECTRODES PLATE/PADDLE, EPIDURAL; Surgeon: Lonn Fillers, MD; Location: MAIN OR [facility]; Service: Neurosurgery  Medical devices from this surgery are in the Medical Devices section.   TONGUE SURGERY     Pt has had 2 Tongue surgeries   TUBAL LIGATION     ULNAR COLLATERAL LIGAMENT REPAIR Left 05/31/2023   Procedure: left thumb ulnar collateral ligament repair;  Surgeon: Murrell Drivers, MD;  Location: [facility];  Service: Orthopedics;  Laterality: Left;   WISDOM TOOTH EXTRACTION     Family History  Problem Relation Age of Onset   Allergic rhinitis Neg Hx    Angioedema Neg Hx    Asthma Neg Hx    Atopy Neg Hx     Eczema Neg Hx    Immunodeficiency Neg Hx    Urticaria Neg Hx    Social History   Tobacco Use   Smoking status: Former    Current packs/day: 0.00    Types: Cigarettes    Quit date: 04/28/1973    Years since quitting: 50.2   Smokeless tobacco: Never  Vaping Use   Vaping status: Never Used  Substance Use Topics   Alcohol use: No   Drug use: Yes    Types: Marijuana    Comment: daily marijuana   Current Outpatient Medications  Medication Sig Dispense Refill   ALPRAZolam  (XANAX )  1 MG tablet Take 0.5-1 mg by mouth daily as needed.     Cholecalciferol (VITAMIN D3) 50 MCG (2000 UT) capsule Take 2,000 Units by mouth daily.     clonazePAM  (KLONOPIN ) 0.5 MG tablet Take 0.5 mg by mouth 2 (two) times daily as needed for anxiety.     cyclobenzaprine  (FLEXERIL ) 10 MG tablet Take 10 mg by mouth 3 (three) times daily as needed for muscle spasms.     diclofenac Sodium (VOLTAREN) 1 % GEL Apply 2 g topically as needed.     Omega-3 Fatty Acids (FISH OIL) 1000 MG CAPS Take by mouth.     progesterone  (PROMETRIUM ) 100 MG capsule Take 100 mg by mouth daily.     Ginger, Zingiber officinalis, 250 MG CAPS Take 1 capsule by mouth daily.     Kelp 0.15 MG TABS Take 1 tablet by mouth every other day.     No current facility-administered medications for this visit.   Allergies  Allergen Reactions   Carbidopa-Levodopa Shortness Of Breath    Felt Winded , sluggish,    Cortisone     Cortisone eye drops nausea and headache   Gabapentin  Anxiety    Tight chest,no appetite,shakey   Amantadines     Flu-Like Symptoms   Bee Venom    Covid-19 (Mrna) Vaccine     Abdominal Pain   Other Diarrhea and Nausea And Vomiting    [name]   Prednisone      Pt sts that she had left facial numbness    Penicillins Swelling    Arm swelling at injection site as a child   Percocet [Oxycodone -Acetaminophen ] Other (See Comments)    Felt like face was drooping     Review of Systems:  All systems reviewed and negative  except where noted in HPI.   Wt Readings from Last 3 Encounters:  07/23/23 180 lb 2 oz (81.7 kg)  05/31/23 177 lb 4 oz (80.4 kg)  02/11/21 170 lb (77.1 kg)    Physical Exam:  BP 120/74   Pulse 81   Ht 5' 4.75 (1.645 m) Comment: Measured in office today.  Wt 180 lb 2 oz (81.7 kg)   SpO2 95%   BMI 30.21 kg/m  Constitutional:  Pleasant, generally well appearing female in no acute distress. Psychiatric:  Normal mood and affect. Behavior is normal. EENT: Pupils normal.  Conjunctivae are normal. No scleral icterus. Neck supple.  Cardiovascular: Normal rate, regular rhythm.  Pulmonary/chest: Effort normal and breath sounds normal. No wheezing, rales or rhonchi. Abdominal: Soft, nondistended, nontender. Bowel sounds active throughout. There are no masses palpable. No hepatomegaly. Neurological: Alert and oriented to person place and time.    Vina Dasen, NP  07/23/2023, 2:25 PM  Cc:  Referring Provider Vaughn Lauraine NOVAK, PA-C

## 2023-07-29 ENCOUNTER — Encounter: Payer: Self-pay | Admitting: Nurse Practitioner

## 2023-07-31 NOTE — Progress Notes (Signed)
 ____________________________________________________________  Attending physician addendum:  Thank you for sending this case to me. I have reviewed the entire note and agree with the plan.  2021 EGD revealed a hiatal hernia.  Symptoms suggestive of persistent reflux symptoms.  If not significantly improved with a change in PPI and close attention to diet and lifestyle measures, then my advice is pH/manometry testing and an upper GI series to see if she may be a candidate for TIF or surgical fundoplication with hernia repair.  Victory Brand, MD  ____________________________________________________________

## 2023-08-08 ENCOUNTER — Telehealth: Payer: Self-pay | Admitting: Nurse Practitioner

## 2023-08-08 DIAGNOSIS — K449 Diaphragmatic hernia without obstruction or gangrene: Secondary | ICD-10-CM

## 2023-08-08 DIAGNOSIS — R1013 Epigastric pain: Secondary | ICD-10-CM

## 2023-08-08 DIAGNOSIS — K2100 Gastro-esophageal reflux disease with esophagitis, without bleeding: Secondary | ICD-10-CM

## 2023-08-08 NOTE — Telephone Encounter (Signed)
Inbound call from patient requesting to speak with [name]. States she was advised to call and give updates regarding symptoms after 1/6 appointment. Please advise, thank you

## 2023-08-08 NOTE — Telephone Encounter (Signed)
Lm on vm for patient to return call 

## 2023-08-09 NOTE — Telephone Encounter (Signed)
Diane Pel, NP  Alima Naser, Nixon, RN Brookyn, Please schedule her for an upper GI series to evaluation symptoms and the hiatal hernia. Thanks   Upper GI series order in epic. Secure staff message sent to radiology scheduling to contact patient to set up appt.

## 2023-08-09 NOTE — Telephone Encounter (Signed)
Pt returned call. Pt reports that her "heartburn is a lot better, food doesn't feel stuck in her throat". Pt reports that it now feels like food is stuck behind her sternum. Pt states that it was mentioned that she may need barium esophagram or EGD. Patient is ready to proceed with next steps. Please advise, thanks.

## 2023-08-14 NOTE — Telephone Encounter (Signed)
Upper GI series scheduled for 08/20/23 at 10 am

## 2023-08-20 ENCOUNTER — Ambulatory Visit (HOSPITAL_COMMUNITY)
Admission: RE | Admit: 2023-08-20 | Discharge: 2023-08-20 | Disposition: A | Discharge status: Home / Self Care | Payer: Medicare Other | Source: Ambulatory Visit | Attending: Nurse Practitioner | Admitting: Nurse Practitioner

## 2023-08-20 DIAGNOSIS — K2100 Gastro-esophageal reflux disease with esophagitis, without bleeding: Secondary | ICD-10-CM | POA: Insufficient documentation

## 2023-08-20 DIAGNOSIS — R1013 Epigastric pain: Secondary | ICD-10-CM | POA: Insufficient documentation

## 2023-08-20 DIAGNOSIS — K449 Diaphragmatic hernia without obstruction or gangrene: Secondary | ICD-10-CM | POA: Insufficient documentation

## 2023-08-22 ENCOUNTER — Other Ambulatory Visit: Payer: Self-pay

## 2023-08-22 DIAGNOSIS — R6881 Early satiety: Secondary | ICD-10-CM

## 2023-08-22 DIAGNOSIS — K2100 Gastro-esophageal reflux disease with esophagitis, without bleeding: Secondary | ICD-10-CM

## 2023-08-22 DIAGNOSIS — K449 Diaphragmatic hernia without obstruction or gangrene: Secondary | ICD-10-CM

## 2023-08-22 DIAGNOSIS — R1013 Epigastric pain: Secondary | ICD-10-CM

## 2023-09-13 ENCOUNTER — Ambulatory Visit: Payer: Self-pay | Admitting: Surgery

## 2023-09-13 NOTE — H&P (View-Only) (Signed)
 [name]   Referring Provider:  Meredith Pel, NP   Subjective   Chief Complaint: New Consultation and Hernia     History of Present Illness: 68 year old woman with history of anxiety, arthritis, depression, GERD, hiatal hernia, osteoporosis, peptic ulcer disease and history of bilateral inguinal hernia repairs, tubal ligation, spinal cord stimulator who presents for consultation regarding hiatal hernia.  She has a history of GERD and has been experiencing postprandial epigastric burning/chest burning with pretty much any food, and regurgitation while also reflux medications.  Regurgitation happens if she bends forward.  Also reports that she does have coughing episodes which sometimes trigger emesis.  She apparently did not tolerate omeprazole and felt that it caused bloating.  She did feel that Flexeril helped her GI symptoms ... She followed up with GI last month and a trial of Pepcid was initiated, which she notes is keeping the reflux under control.  Antireflux measures were discussed with the patient at that visit.  And was to follow-up with them in a few weeks with a condition update and consider repeat EGD if no improvement.  Phone call documentation from January 23 describes significant improvement in heartburn and no sticking in her throat but did feel like food sticking behind her sternum.  Subsequently an upper GI was performed, see below.  She is interested in proceeding with repair.   Upper GI 08/20/2023: Moderate to large hiatal hernia, scoliosis.  No obstruction; barium tablet passed into the stomach easily; no reflux visualized nor overt esophageal dysmotility.  EGD 01/01/2020 Dr. Orvan Bryant: Medium size hiatal hernia, grade 2 Hill valve, no Cameron's ulcers.  Despite Diane Bryant normal-appearing esophagus and gastric mucosa, biopsies from that study demonstrated H. pylori gastritis and reflux esophagitis.  Social history notable for marijuana use, former cigarette  smoker, quit in 1974.  Review of Systems: A complete review of systems was obtained from the patient.  I have reviewed this information and discussed as appropriate with the patient.  See HPI as well for other ROS.   Medical History: Past Medical History:  Diagnosis Date   Anxiety    Arthritis    GERD (gastroesophageal reflux disease)     There is no problem list on file for this patient.   Past Surgical History:  Procedure Laterality Date   HERNIA REPAIR Bilateral 1990   inguinal   Excision metacarpal mass Right 05/01/2013   Carpometacarpel suspension plasty Right 02/13/2017   EGD  01/01/2020   PR SURG IMPLNT NEUROELECT,EPIDURAL  04/29/2021   Ulnar collateral ligament repair  Left 05/31/2023   JOINT REPLACEMENT     LAPAROSCOPIC TUBAL LIGATION     tongue surgery     x2     No Known Allergies  Current Outpatient Medications on File Prior to Visit  Medication Sig Dispense Refill   ALPRAZolam (XANAX) 1 MG tablet Take 1 tablet by mouth once daily as needed     clonazePAM (KLONOPIN) 0.5 MG tablet Take 0.5 mg by mouth 2 (two) times daily as needed     cyclobenzaprine (FLEXERIL) 10 MG tablet Take 1 tablet by mouth at bedtime as needed     famotidine (PEPCID) 40 MG tablet Take 40 mg by mouth once daily     No current facility-administered medications on file prior to visit.    Family History  Problem Relation Age of Onset   Hyperlipidemia (Elevated cholesterol) Mother    Skin cancer Brother    High blood pressure (Hypertension) Brother  Social History   Tobacco Use  Smoking Status Former   Types: Cigarettes  Smokeless Tobacco Never     Social History   Socioeconomic History   Marital status: Married  Tobacco Use   Smoking status: Former    Types: Cigarettes   Smokeless tobacco: Never  Substance and Sexual Activity   Alcohol use: Not Currently   Drug use: Never   Social Drivers of Health   Financial Resource Strain: Low Risk  (09/11/2022)   Received  from [facility]   Overall Financial Resource Strain (CARDIA)    Difficulty of Paying Living Expenses: Not hard at all  Food Insecurity: Low Risk  (08/17/2023)   Received from [facility]   Hunger Vital Sign    Worried About Running Out of Food in the Last Year: Never true    Ran Out of Food in the Last Year: Never true  Transportation Needs: No Transportation Needs (08/17/2023)   Received from [facility]    In the past 12 months, has lack of reliable transportation kept you from medical appointments, meetings, work or from getting things needed for daily living? : No  Physical Activity: Sufficiently Active (09/11/2022)   Received from [facility]   Exercise Vital Sign    Days of Exercise per Week: 7 days    Minutes of Exercise per Session: 70 min  Stress: Stress Concern Present (09/11/2022)   Received from [facility] - Occupational Stress Questionnaire    Feeling of Stress : Rather much  Social Connections: Socially Integrated (09/11/2022)   Received from [facility]   Social Network    How would you rate your social network (family, work, friends)?: Good participation with social networks  Housing Stability: Unknown (09/13/2023)   Housing Stability Vital Sign    Homeless in the Last Year: No    Objective:    Vitals:   09/13/23 0954  BP: 107/69  Pulse: 96  Temp: 36.7 C (98 F)  SpO2: 96%  Weight: 79.4 kg (175 lb)  Height: 165.1 cm (5\' 5" )    Body mass index is 29.12 kg/m.  Gen: A&Ox3, no distress  Unlabored respirations   Assessment and Plan:  Diagnoses and all orders for this visit:  Hiatal hernia with gastroesophageal reflux    I discussed the anatomy using a diagram to demonstrate and described robotic hiatal hernia repair with possible mesh with fundoplication/wrap versus gastropexy. We discussed the typical hospitalization. We discussed the typical recovery and activity limitations. We  discussed the diet transition over the course of 8 to 12 weeks. We discussed the need to change eating techniques and behaviors in order to mitigate postoperative pain and discomfort. We discussed that hiatal hernia repair/reflux surgery is not "perfect " and GI function/foregut function will be unlikely to be perfect after this surgery.   We then discussed the risks of surgery included but not limited to bleeding, infection, ongoing pain, scarring, injury to surrounding structures, injury to the lung/aorta, injury to the vagus nerves, hiatal hernia recurrence, clot formation, perioperative cardiac and pulmonary events. We discussed slipped wrap. We discussed the issues such as gastroparesis/early satiey, poor gastric emptying, and ongoing diarrhea. We discussed gas bloat.  Dysphagia.  We discussed issues with potential mesh insertion such as dysphagia/fistula. We discussed that I am typically selective in whether or not we use absorbable mesh. We discussed the hiatal hernia recurrence risk is at least 15% even in optimal conditions.  Questions were welcomed and answered to her satisfaction.  She wishes to proceed with surgery.   Jeronimo Hellberg Carlye Grippe, MD

## 2023-09-13 NOTE — H&P (Signed)
 Retina Bernardy [id number]   Referring Provider:  Meredith Pel, NP   Subjective   Chief Complaint: New Consultation and Hernia     History of Present Illness: 68 year old woman with history of anxiety, arthritis, depression, GERD, hiatal hernia, osteoporosis, peptic ulcer disease and history of bilateral inguinal hernia repairs, tubal ligation, spinal cord stimulator who presents for consultation regarding hiatal hernia.  She has a history of GERD and has been experiencing postprandial epigastric burning/chest burning with pretty much any food, and regurgitation while also reflux medications.  Regurgitation happens if she bends forward.  Also reports that she does have coughing episodes which sometimes trigger emesis.  She apparently did not tolerate omeprazole and felt that it caused bloating.  She did feel that Flexeril helped her GI symptoms ... She followed up with GI last month and a trial of Pepcid was initiated, which she notes is keeping the reflux under control.  Antireflux measures were discussed with the patient at that visit.  And was to follow-up with them in a few weeks with a condition update and consider repeat EGD if no improvement.  Phone call documentation from January 23 describes significant improvement in heartburn and no sticking in her throat but did feel like food sticking behind her sternum.  Subsequently an upper GI was performed, see below.  She is interested in proceeding with repair.   Upper GI 08/20/2023: Moderate to large hiatal hernia, scoliosis.  No obstruction; barium tablet passed into the stomach easily; no reflux visualized nor overt esophageal dysmotility.  EGD 01/01/2020 Dr. Orvan Falconer: Medium size hiatal hernia, grade 2 Hill valve, no Cameron's ulcers.  Despite [place] normal-appearing esophagus and gastric mucosa, biopsies from that study demonstrated H. pylori gastritis and reflux esophagitis.  Social history notable for marijuana use, former cigarette  smoker, quit in 1974.  Review of Systems: A complete review of systems was obtained from the patient.  I have reviewed this information and discussed as appropriate with the patient.  See HPI as well for other ROS.   Medical History: Past Medical History:  Diagnosis Date   Anxiety    Arthritis    GERD (gastroesophageal reflux disease)     There is no problem list on file for this patient.   Past Surgical History:  Procedure Laterality Date   HERNIA REPAIR Bilateral 1990   inguinal   Excision metacarpal mass Right 05/01/2013   Carpometacarpel suspension plasty Right 02/13/2017   EGD  01/01/2020   PR SURG IMPLNT NEUROELECT,EPIDURAL  04/29/2021   Ulnar collateral ligament repair  Left 05/31/2023   JOINT REPLACEMENT     LAPAROSCOPIC TUBAL LIGATION     tongue surgery     x2     No Known Allergies  Current Outpatient Medications on File Prior to Visit  Medication Sig Dispense Refill   ALPRAZolam (XANAX) 1 MG tablet Take 1 tablet by mouth once daily as needed     clonazePAM (KLONOPIN) 0.5 MG tablet Take 0.5 mg by mouth 2 (two) times daily as needed     cyclobenzaprine (FLEXERIL) 10 MG tablet Take 1 tablet by mouth at bedtime as needed     famotidine (PEPCID) 40 MG tablet Take 40 mg by mouth once daily     No current facility-administered medications on file prior to visit.    Family History  Problem Relation Age of Onset   Hyperlipidemia (Elevated cholesterol) Mother    Skin cancer Brother    High blood pressure (Hypertension) Brother  Social History   Tobacco Use  Smoking Status Former   Types: Cigarettes  Smokeless Tobacco Never     Social History   Socioeconomic History   Marital status: Married  Tobacco Use   Smoking status: Former    Types: Cigarettes   Smokeless tobacco: Never  Substance and Sexual Activity   Alcohol use: Not Currently   Drug use: Never   Social Drivers of Health   Financial Resource Strain: Low Risk  (09/11/2022)   Received  from [facility]   Overall Financial Resource Strain (CARDIA)    Difficulty of Paying Living Expenses: Not hard at all  Food Insecurity: Low Risk  (08/17/2023)   Received from [facility]   Hunger Vital Sign    Worried About Running Out of Food in the Last Year: Never true    Ran Out of Food in the Last Year: Never true  Transportation Needs: No Transportation Needs (08/17/2023)   Received from [facility]    In the past 12 months, has lack of reliable transportation kept you from medical appointments, meetings, work or from getting things needed for daily living? : No  Physical Activity: Sufficiently Active (09/11/2022)   Received from [facility]   Exercise Vital Sign    Days of Exercise per Week: 7 days    Minutes of Exercise per Session: 70 min  Stress: Stress Concern Present (09/11/2022)   Received from [facility] - Occupational Stress Questionnaire    Feeling of Stress : Rather much  Social Connections: Socially Integrated (09/11/2022)   Received from [facility]   Social Network    How would you rate your social network (family, work, friends)?: Good participation with social networks  Housing Stability: Unknown (09/13/2023)   Housing Stability Vital Sign    Homeless in the Last Year: No    Objective:    Vitals:   09/13/23 0954  BP: 107/69  Pulse: 96  Temp: 36.7 C (98 F)  SpO2: 96%  Weight: 79.4 kg (175 lb)  Height: 165.1 cm (5\' 5" )    Body mass index is 29.12 kg/m.  Gen: A&Ox3, no distress  Unlabored respirations   Assessment and Plan:  Diagnoses and all orders for this visit:  Hiatal hernia with gastroesophageal reflux    I discussed the anatomy using a diagram to demonstrate and described robotic hiatal hernia repair with possible mesh with fundoplication/wrap versus gastropexy. We discussed the typical hospitalization. We discussed the typical recovery and activity limitations. We  discussed the diet transition over the course of 8 to 12 weeks. We discussed the need to change eating techniques and behaviors in order to mitigate postoperative pain and discomfort. We discussed that hiatal hernia repair/reflux surgery is not "perfect " and GI function/foregut function will be unlikely to be perfect after this surgery.   We then discussed the risks of surgery included but not limited to bleeding, infection, ongoing pain, scarring, injury to surrounding structures, injury to the lung/aorta, injury to the vagus nerves, hiatal hernia recurrence, clot formation, perioperative cardiac and pulmonary events. We discussed slipped wrap. We discussed the issues such as gastroparesis/early satiey, poor gastric emptying, and ongoing diarrhea. We discussed gas bloat.  Dysphagia.  We discussed issues with potential mesh insertion such as dysphagia/fistula. We discussed that I am typically selective in whether or not we use absorbable mesh. We discussed the hiatal hernia recurrence risk is at least 15% even in optimal conditions.  Questions were welcomed and answered to her satisfaction.  She wishes to proceed with surgery.   Jeronimo Hellberg Carlye Grippe, MD

## 2023-10-04 NOTE — Progress Notes (Signed)
 COVID Vaccine received:  []  No [x]  Yes Date of any COVID positive Test in last 90 days:  PCP - Ralene Ok, PA-C Cardiologist -   Chest x-ray - 12-30-2012  2v  Epic EKG -  (09-06-21  CEW  will repeat Stress Test -  ECHO -  Cardiac Cath -   PCR screen: []  Ordered & Completed []   No Order but Needs PROFEND     [x]   N/A for this surgery  Surgery Plan:  []  Ambulatory   []  Outpatient in bed  [x]  Admit Anesthesia:    [x]  General  []  Spinal  []   Choice []   MAC  Bowel Prep - [x]  No  []   Yes ______  Pacemaker / ICD device [x]  No []  Yes   Spinal Cord Stimulator:[]  No [x]  Yes  Bring remote     History of Sleep Apnea? [x]  No []  Yes   CPAP used?- [x]  No []  Yes    Does the patient monitor blood sugar?   [x]  N/A   []  No []  Yes  Patient has: [x]  NO Hx DM   []  Pre-DM   []  DM1  []   DM2  Blood Thinner / Instructions: none Aspirin Instructions:  none  ERAS Protocol Ordered: [x]  No  []  Yes Patient is to be NPO after:  midnight prior  Dental hx: []  Dentures:  []  N/A      []  Bridge or Partial:                   []  Loose or Damaged teeth:   Comments:   Activity level: Patient is able / unable to climb a flight of stairs without difficulty; []  No CP  []  No SOB, but would have ___   Patient can / can not perform ADLs without assistance.   Anesthesia review: Anxiety, Benign tremors- Parkinson's, Hx Heart Murmur (had ECHO  Patient denies shortness of breath, fever, cough and chest pain at PAT appointment.  Patient verbalized understanding and agreement to the Pre-Surgical Instructions that were given to them at this PAT appointment. Patient was also educated of the need to review these PAT instructions again prior to her surgery.I reviewed the appropriate phone numbers to call if they have any and questions or concerns.

## 2023-10-04 NOTE — Patient Instructions (Signed)
 SURGICAL WAITING ROOM VISITATION Patients having surgery or a procedure may have no more than 2 support people in the waiting area - these visitors may rotate in the visitor waiting room.   Due to an increase in RSV and influenza rates and associated hospitalizations, children ages 79 and under may not visit patients in [facility] hospitals. If the patient needs to stay at the hospital during part of their recovery, the visitor guidelines for inpatient rooms apply.  PRE-OP VISITATION  Pre-op nurse will coordinate an appropriate time for 1 support person to accompany the patient in pre-op.  This support person may not rotate.  This visitor will be contacted when the time is appropriate for the visitor to come back in the pre-op area.  Please refer to the [facility] website for the visitor guidelines for Inpatients (after your surgery is over and you are in a regular room).  You are not required to quarantine at this time prior to your surgery. However, you must do this: Hand Hygiene often Do NOT share personal items Notify your provider if you are in close contact with someone who has COVID or you develop fever 100.4 or greater, new onset of sneezing, cough, sore throat, shortness of breath or body aches.  If you test positive for Covid or have been in contact with anyone that has tested positive in the last 10 days please notify you surgeon.    Your procedure is scheduled on:  Monday  October 08, 2023   Report to [facility] Main Entrance: [address] entrance where the [employer] is available.   Report to admitting at: 07:45    AM  Call this number if you have any questions or problems the morning of surgery [phone]  DO NOT EAT OR DRINK ANYTHING AFTER MIDNIGHT THE NIGHT PRIOR TO YOUR SURGERY / PROCEDURE.   FOLLOW  ANY ADDITIONAL PRE OP INSTRUCTIONS YOU RECEIVED FROM YOUR SURGEON'S OFFICE!!!   Oral Hygiene is also important to reduce your risk of infection.        Remember  - BRUSH YOUR TEETH THE MORNING OF SURGERY WITH YOUR REGULAR TOOTHPASTE  Do NOT smoke after Midnight the night before surgery.  STOP TAKING all Vitamins, Herbs and supplements 1 week before your surgery.   Take ONLY these medicines the morning of surgery with A SIP OF WATER: Famotidine, and you may take EITHER Clonazepam or alprazolam if needed.                    You may not have any metal on your body including hair pins, jewelry, and body piercing  Do not wear make-up, lotions, powders, perfumes or deodorant  Do not wear nail polish including gel and S&S, artificial / acrylic nails, or any other type of covering on natural nails including finger and toenails. If you have artificial nails, gel coating, etc., that needs to be removed by a nail salon, Please have this removed prior to surgery. Not doing so may mean that your surgery could be cancelled or delayed if the Surgeon or anesthesia staff feels like they are unable to monitor you safely.   Do not shave 48 hours prior to surgery to avoid nicks in your skin which may contribute to postoperative infections.   Contacts, Hearing Aids, dentures or bridgework may not be worn into surgery. DENTURES WILL BE REMOVED PRIOR TO SURGERY PLEASE DO NOT APPLY "Poly grip" OR ADHESIVES!!!  You may bring a small overnight bag with  you on the day of surgery, only pack items that are not valuable. [facility] IS NOT RESPONSIBLE   FOR VALUABLES THAT ARE LOST OR STOLEN.    Do not bring your home medications to the hospital. The Pharmacy will dispense medications listed on your medication list to you during your admission in the Hospital.  Special Instructions: Bring a copy of your healthcare power of attorney and living will documents the day of surgery, if you wish to have them scanned into your [facility]- EPIC  Please read over the following fact sheets you were given: IF YOU HAVE QUESTIONS ABOUT YOUR PRE-OP INSTRUCTIONS, PLEASE CALL  [phone].   [facility] - Preparing for Surgery Before surgery, you can play an important role.  Because skin is not sterile, your skin needs to be as free of germs as possible.  You can reduce the number of germs on your skin by washing with CHG (chlorahexidine gluconate) soap before surgery.  CHG is an antiseptic cleaner which kills germs and bonds with the skin to continue killing germs even after washing. Please DO NOT use if you have an allergy to CHG or antibacterial soaps.  If your skin becomes reddened/irritated stop using the CHG and inform your nurse when you arrive at Short Stay. Do not shave (including legs and underarms) for at least 48 hours prior to the first CHG shower.  You may shave your face/neck.  Please follow these instructions carefully:  1.  Shower with CHG Soap the night before surgery and the  morning of surgery.  2.  If you choose to wash your hair, wash your hair first as usual with your normal  shampoo.  3.  After you shampoo, rinse your hair and body thoroughly to remove the shampoo.                             4.  Use CHG as you would any other liquid soap.  You can apply chg directly to the skin and wash.  Gently with a scrungie or clean washcloth.  5.  Apply the CHG Soap to your body ONLY FROM THE NECK DOWN.   Do not use on face/ open                           Wound or open sores. Avoid contact with eyes, ears mouth and genitals (private parts).                       Wash face,  Genitals (private parts) with your normal soap.             6.  Wash thoroughly, paying special attention to the area where your  surgery  will be performed.  7.  Thoroughly rinse your body with warm water from the neck down.  8.  DO NOT shower/wash with your normal soap after using and rinsing off the CHG Soap.            9.  Pat yourself dry with a clean towel.            10.  Wear clean pajamas.            11.  Place clean sheets on your bed the night of your first shower and do  not  sleep with pets.  ON THE DAY OF SURGERY : Do not apply any  lotions/deodorants the morning of surgery.  Please wear clean clothes to the hospital/surgery center.    FAILURE TO FOLLOW THESE INSTRUCTIONS MAY RESULT IN THE CANCELLATION OF YOUR SURGERY  PATIENT SIGNATURE_________________________________  NURSE SIGNATURE__________________________________  ________________________________________________________________________

## 2023-10-05 ENCOUNTER — Encounter (HOSPITAL_COMMUNITY): Payer: Self-pay

## 2023-10-05 ENCOUNTER — Other Ambulatory Visit: Payer: Self-pay

## 2023-10-05 ENCOUNTER — Encounter (HOSPITAL_COMMUNITY)
Admission: RE | Admit: 2023-10-05 | Discharge: 2023-10-05 | Disposition: A | Discharge status: Home / Self Care | Source: Ambulatory Visit | Attending: Surgery | Admitting: Surgery

## 2023-10-05 VITALS — BP 106/66 | HR 75 | Temp 98.3°F | Resp 14 | Ht 64.75 in | Wt 172.0 lb

## 2023-10-05 DIAGNOSIS — Z01818 Encounter for other preprocedural examination: Secondary | ICD-10-CM | POA: Diagnosis present

## 2023-10-05 HISTORY — DX: Personal history of other diseases of the digestive system: Z87.19

## 2023-10-05 HISTORY — DX: Pneumonia, unspecified organism: J18.9

## 2023-10-05 HISTORY — DX: Family history of other specified conditions: Z84.89

## 2023-10-05 LAB — CBC
HCT: 43.9 % (ref 36.0–46.0)
Hemoglobin: 14.1 g/dL (ref 12.0–15.0)
MCH: 32.6 pg (ref 26.0–34.0)
MCHC: 32.1 g/dL (ref 30.0–36.0)
MCV: 101.6 fL — ABNORMAL HIGH (ref 80.0–100.0)
Platelets: 230 10*3/uL (ref 150–400)
RBC: 4.32 MIL/uL (ref 3.87–5.11)
RDW: 12.2 % (ref 11.5–15.5)
WBC: 13.7 10*3/uL — ABNORMAL HIGH (ref 4.0–10.5)
nRBC: 0 % (ref 0.0–0.2)

## 2023-10-05 LAB — COMPREHENSIVE METABOLIC PANEL
ALT: 14 U/L (ref 0–44)
AST: 17 U/L (ref 15–41)
Albumin: 3.7 g/dL (ref 3.5–5.0)
Alkaline Phosphatase: 71 U/L (ref 38–126)
Anion gap: 7 (ref 5–15)
BUN: 13 mg/dL (ref 8–23)
CO2: 24 mmol/L (ref 22–32)
Calcium: 9.2 mg/dL (ref 8.9–10.3)
Chloride: 106 mmol/L (ref 98–111)
Creatinine, Ser: 0.8 mg/dL (ref 0.44–1.00)
GFR, Estimated: >60 mL/min (ref >60)
Glucose, Bld: 83 mg/dL (ref 70–99)
Potassium: 4 mmol/L (ref 3.5–5.1)
Sodium: 137 mmol/L (ref 135–145)
Total Bilirubin: 0.4 mg/dL (ref 0.0–1.2)
Total Protein: 6.8 g/dL (ref 6.5–8.1)

## 2023-10-08 ENCOUNTER — Observation Stay (HOSPITAL_COMMUNITY)
Admission: RE | Admit: 2023-10-08 | Discharge: 2023-10-09 | Disposition: A | Discharge status: Home / Self Care | Attending: Surgery | Admitting: Surgery

## 2023-10-08 ENCOUNTER — Encounter (HOSPITAL_COMMUNITY)
Admission: RE | Disposition: A | Discharge status: Home / Self Care | Payer: Self-pay | Source: Home / Self Care | Attending: Surgery

## 2023-10-08 ENCOUNTER — Encounter (HOSPITAL_COMMUNITY): Payer: Self-pay | Admitting: Surgery

## 2023-10-08 ENCOUNTER — Inpatient Hospital Stay (HOSPITAL_BASED_OUTPATIENT_CLINIC_OR_DEPARTMENT_OTHER): Admitting: Anesthesiology

## 2023-10-08 ENCOUNTER — Other Ambulatory Visit: Payer: Self-pay

## 2023-10-08 ENCOUNTER — Inpatient Hospital Stay (HOSPITAL_COMMUNITY): Payer: Self-pay | Admitting: Physician Assistant

## 2023-10-08 DIAGNOSIS — K449 Diaphragmatic hernia without obstruction or gangrene: Principal | ICD-10-CM | POA: Insufficient documentation

## 2023-10-08 DIAGNOSIS — Z8719 Personal history of other diseases of the digestive system: Principal | ICD-10-CM

## 2023-10-08 DIAGNOSIS — J45909 Unspecified asthma, uncomplicated: Secondary | ICD-10-CM

## 2023-10-08 DIAGNOSIS — F418 Other specified anxiety disorders: Secondary | ICD-10-CM | POA: Diagnosis not present

## 2023-10-08 DIAGNOSIS — Z87891 Personal history of nicotine dependence: Secondary | ICD-10-CM

## 2023-10-08 DIAGNOSIS — K219 Gastro-esophageal reflux disease without esophagitis: Secondary | ICD-10-CM | POA: Diagnosis not present

## 2023-10-08 HISTORY — PX: LAPAROSCOPIC GASTRECTOMY: SHX5894

## 2023-10-08 HISTORY — PX: ROBOTIC ASSISTED LAPAROSCOPIC REPAIR OF PARAESOPHAGEAL HERNIA: SHX6606

## 2023-10-08 HISTORY — PX: UPPER GI ENDOSCOPY: SHX6162

## 2023-10-08 SURGERY — ROBOTIC ASSISTED LAPAROSCOPIC REPAIR OF PARAESOPHAGEAL HERNIA
Anesthesia: General | Site: Abdomen

## 2023-10-08 MED ORDER — ALPRAZOLAM 0.5 MG PO TABS
1.0000 mg | ORAL_TABLET | Freq: Every day | ORAL | Status: DC | PRN
  Administered 2023-10-09: 1 mg via ORAL
  Filled 2023-10-08: qty 2

## 2023-10-08 MED ORDER — DOCUSATE SODIUM 100 MG PO CAPS
100.0000 mg | ORAL_CAPSULE | Freq: Two times a day (BID) | ORAL | Status: DC
  Administered 2023-10-08 – 2023-10-09 (×2): 100 mg via ORAL
  Filled 2023-10-08 (×2): qty 1

## 2023-10-08 MED ORDER — BUPIVACAINE LIPOSOME 1.3 % IJ SUSP
INTRAMUSCULAR | Status: DC | PRN
  Administered 2023-10-08: 20 mL

## 2023-10-08 MED ORDER — CHLORHEXIDINE GLUCONATE 4 % EX SOLN: 60.0000 mL | Freq: Once | CUTANEOUS | Status: DC

## 2023-10-08 MED ORDER — DEXMEDETOMIDINE HCL IN NACL 80 MCG/20ML IV SOLN
INTRAVENOUS | Status: DC | PRN
  Administered 2023-10-08: 4 ug via INTRAVENOUS

## 2023-10-08 MED ORDER — GABAPENTIN 300 MG PO CAPS
300.0000 mg | ORAL_CAPSULE | ORAL | Status: DC
  Filled 2023-10-08: qty 1

## 2023-10-08 MED ORDER — BISACODYL 10 MG RE SUPP: 10.0000 mg | Freq: Every day | RECTAL | Status: DC | PRN

## 2023-10-08 MED ORDER — SODIUM CHLORIDE 0.9 % IV SOLN: INTRAVENOUS | Status: DC

## 2023-10-08 MED ORDER — LABETALOL HCL 5 MG/ML IV SOLN
INTRAVENOUS | Status: DC | PRN
  Administered 2023-10-08: 2.5 mg via INTRAVENOUS

## 2023-10-08 MED ORDER — TRAMADOL HCL 50 MG PO TABS
50.0000 mg | ORAL_TABLET | Freq: Four times a day (QID) | ORAL | Status: DC | PRN
  Administered 2023-10-08: 50 mg via ORAL
  Filled 2023-10-08: qty 1

## 2023-10-08 MED ORDER — HYDROMORPHONE HCL 1 MG/ML IJ SOLN
0.5000 mg | INTRAMUSCULAR | Status: DC | PRN
  Administered 2023-10-08: 0.5 mg via INTRAVENOUS
  Filled 2023-10-08: qty 0.5

## 2023-10-08 MED ORDER — ACETAMINOPHEN 500 MG PO TABS
1000.0000 mg | ORAL_TABLET | ORAL | Status: AC
  Administered 2023-10-08: 1000 mg via ORAL
  Filled 2023-10-08: qty 2

## 2023-10-08 MED ORDER — BUPIVACAINE-EPINEPHRINE (PF) 0.25% -1:200000 IJ SOLN
INTRAMUSCULAR | Status: AC
  Filled 2023-10-08: qty 30

## 2023-10-08 MED ORDER — BUPIVACAINE LIPOSOME 1.3 % IJ SUSP
INTRAMUSCULAR | Status: AC
  Filled 2023-10-08: qty 20

## 2023-10-08 MED ORDER — CLONAZEPAM 0.5 MG PO TABS: 0.5000 mg | ORAL_TABLET | Freq: Two times a day (BID) | ORAL | Status: DC | PRN

## 2023-10-08 MED ORDER — VANCOMYCIN HCL IN DEXTROSE 1-5 GM/200ML-% IV SOLN: 1000.0000 mg | INTRAVENOUS | Status: DC

## 2023-10-08 MED ORDER — PHENYLEPHRINE HCL (PRESSORS) 10 MG/ML IV SOLN
INTRAVENOUS | Status: DC | PRN
  Administered 2023-10-08: 80 ug via INTRAVENOUS

## 2023-10-08 MED ORDER — ONDANSETRON HCL 4 MG/2ML IJ SOLN
4.0000 mg | Freq: Four times a day (QID) | INTRAMUSCULAR | Status: DC | PRN
  Administered 2023-10-08: 4 mg via INTRAVENOUS
  Filled 2023-10-08: qty 2

## 2023-10-08 MED ORDER — PHENYLEPHRINE HCL (PRESSORS) 10 MG/ML IV SOLN
INTRAVENOUS | Status: AC
  Filled 2023-10-08: qty 1

## 2023-10-08 MED ORDER — LIDOCAINE HCL (PF) 2 % IJ SOLN
INTRAMUSCULAR | Status: DC | PRN
  Administered 2023-10-08: 1.5 mg/kg/h via INTRADERMAL

## 2023-10-08 MED ORDER — ORAL CARE MOUTH RINSE: 15.0000 mL | Freq: Once | OROMUCOSAL | Status: AC

## 2023-10-08 MED ORDER — LACTATED RINGERS IV SOLN: INTRAVENOUS | Status: DC

## 2023-10-08 MED ORDER — PROCHLORPERAZINE EDISYLATE 10 MG/2ML IJ SOLN
5.0000 mg | Freq: Four times a day (QID) | INTRAMUSCULAR | Status: DC | PRN
  Administered 2023-10-08: 10 mg via INTRAVENOUS
  Filled 2023-10-08: qty 2

## 2023-10-08 MED ORDER — SIMETHICONE 80 MG PO CHEW
40.0000 mg | CHEWABLE_TABLET | Freq: Four times a day (QID) | ORAL | Status: DC | PRN
  Administered 2023-10-08: 40 mg via ORAL
  Filled 2023-10-08 (×2): qty 1

## 2023-10-08 MED ORDER — NAPHAZOLINE-GLYCERIN 0.012-0.25 % OP SOLN: 1.0000 [drp] | Freq: Four times a day (QID) | OPHTHALMIC | Status: DC | PRN

## 2023-10-08 MED ORDER — BUPIVACAINE-EPINEPHRINE 0.25% -1:200000 IJ SOLN
INTRAMUSCULAR | Status: DC | PRN
  Administered 2023-10-08: 30 mL

## 2023-10-08 MED ORDER — FENTANYL CITRATE (PF) 250 MCG/5ML IJ SOLN
INTRAMUSCULAR | Status: AC
  Filled 2023-10-08: qty 5

## 2023-10-08 MED ORDER — SUGAMMADEX SODIUM 200 MG/2ML IV SOLN
INTRAVENOUS | Status: AC
  Filled 2023-10-08: qty 2

## 2023-10-08 MED ORDER — HEPARIN SODIUM (PORCINE) 5000 UNIT/ML IJ SOLN
5000.0000 [IU] | Freq: Three times a day (TID) | INTRAMUSCULAR | Status: DC
  Administered 2023-10-08 – 2023-10-09 (×2): 5000 [IU] via SUBCUTANEOUS
  Filled 2023-10-08 (×2): qty 1

## 2023-10-08 MED ORDER — CEFAZOLIN SODIUM-DEXTROSE 2-4 GM/100ML-% IV SOLN
2.0000 g | Freq: Once | INTRAVENOUS | Status: AC
  Administered 2023-10-08: 2 g via INTRAVENOUS
  Filled 2023-10-08: qty 100

## 2023-10-08 MED ORDER — HYDRALAZINE HCL 20 MG/ML IJ SOLN: 10.0000 mg | INTRAMUSCULAR | Status: DC | PRN

## 2023-10-08 MED ORDER — PROPOFOL 10 MG/ML IV BOLUS
INTRAVENOUS | Status: DC | PRN
  Administered 2023-10-08: 150 mg via INTRAVENOUS

## 2023-10-08 MED ORDER — BUPIVACAINE LIPOSOME 1.3 % IJ SUSP
20.0000 mL | Freq: Once | INTRAMUSCULAR | Status: DC
Start: 2023-10-08 — End: 2023-10-08

## 2023-10-08 MED ORDER — ONDANSETRON HCL 4 MG/2ML IJ SOLN
INTRAMUSCULAR | Status: DC | PRN
  Administered 2023-10-08: 4 mg via INTRAVENOUS

## 2023-10-08 MED ORDER — ONDANSETRON HCL 4 MG/2ML IJ SOLN: 4.0000 mg | Freq: Once | INTRAMUSCULAR | Status: DC | PRN

## 2023-10-08 MED ORDER — FENTANYL CITRATE PF 50 MCG/ML IJ SOSY
25.0000 ug | PREFILLED_SYRINGE | INTRAMUSCULAR | Status: DC | PRN
Start: 2023-10-08 — End: 2023-10-08

## 2023-10-08 MED ORDER — FENTANYL CITRATE (PF) 100 MCG/2ML IJ SOLN
INTRAMUSCULAR | Status: DC | PRN
  Administered 2023-10-08: 25 ug via INTRAVENOUS
  Administered 2023-10-08 (×2): 50 ug via INTRAVENOUS
  Administered 2023-10-08: 25 ug via INTRAVENOUS
  Administered 2023-10-08: 50 ug via INTRAVENOUS
  Administered 2023-10-08: 25 ug via INTRAVENOUS
  Administered 2023-10-08 (×2): 50 ug via INTRAVENOUS
  Administered 2023-10-08: 25 ug via INTRAVENOUS

## 2023-10-08 MED ORDER — LIDOCAINE HCL 2 % IJ SOLN
INTRAMUSCULAR | Status: AC
  Filled 2023-10-08: qty 20

## 2023-10-08 MED ORDER — DIPHENHYDRAMINE HCL 12.5 MG/5ML PO ELIX: 12.5000 mg | ORAL_SOLUTION | Freq: Four times a day (QID) | ORAL | Status: DC | PRN

## 2023-10-08 MED ORDER — ROCURONIUM BROMIDE 100 MG/10ML IV SOLN
INTRAVENOUS | Status: DC | PRN
  Administered 2023-10-08: 10 mg via INTRAVENOUS
  Administered 2023-10-08: 20 mg via INTRAVENOUS
  Administered 2023-10-08: 70 mg via INTRAVENOUS

## 2023-10-08 MED ORDER — PROCHLORPERAZINE MALEATE 10 MG PO TABS: 10.0000 mg | ORAL_TABLET | Freq: Four times a day (QID) | ORAL | Status: DC | PRN

## 2023-10-08 MED ORDER — LIDOCAINE HCL (CARDIAC) PF 100 MG/5ML IV SOSY
PREFILLED_SYRINGE | INTRAVENOUS | Status: DC | PRN
  Administered 2023-10-08: 60 mg via INTRAVENOUS

## 2023-10-08 MED ORDER — DIPHENHYDRAMINE HCL 50 MG/ML IJ SOLN: 12.5000 mg | Freq: Four times a day (QID) | INTRAMUSCULAR | Status: DC | PRN

## 2023-10-08 MED ORDER — SUGAMMADEX SODIUM 200 MG/2ML IV SOLN
INTRAVENOUS | Status: DC | PRN
  Administered 2023-10-08: 200 mg via INTRAVENOUS

## 2023-10-08 MED ORDER — ONDANSETRON HCL 4 MG/2ML IJ SOLN
INTRAMUSCULAR | Status: AC
  Filled 2023-10-08: qty 2

## 2023-10-08 MED ORDER — PROPOFOL 10 MG/ML IV BOLUS
INTRAVENOUS | Status: AC
  Filled 2023-10-08: qty 20

## 2023-10-08 MED ORDER — CYCLOBENZAPRINE HCL 10 MG PO TABS: 10.0000 mg | ORAL_TABLET | Freq: Three times a day (TID) | ORAL | Status: DC | PRN

## 2023-10-08 MED ORDER — FAMOTIDINE 20 MG PO TABS
40.0000 mg | ORAL_TABLET | Freq: Every day | ORAL | Status: DC
  Administered 2023-10-09: 40 mg via ORAL
  Filled 2023-10-08: qty 2

## 2023-10-08 MED ORDER — ACETAMINOPHEN 500 MG PO TABS
1000.0000 mg | ORAL_TABLET | Freq: Four times a day (QID) | ORAL | Status: DC
  Administered 2023-10-08 – 2023-10-09 (×2): 1000 mg via ORAL
  Filled 2023-10-08 (×4): qty 2

## 2023-10-08 MED ORDER — CHLORHEXIDINE GLUCONATE 0.12 % MT SOLN
15.0000 mL | Freq: Once | OROMUCOSAL | Status: AC
  Administered 2023-10-08: 15 mL via OROMUCOSAL

## 2023-10-08 MED ORDER — MIDAZOLAM HCL 5 MG/5ML IJ SOLN
INTRAMUSCULAR | Status: DC | PRN
  Administered 2023-10-08 (×2): 1 mg via INTRAVENOUS

## 2023-10-08 MED ORDER — ONDANSETRON 4 MG PO TBDP: 4.0000 mg | ORAL_TABLET | Freq: Four times a day (QID) | ORAL | Status: DC | PRN

## 2023-10-08 MED ORDER — METHOCARBAMOL 500 MG PO TABS
500.0000 mg | ORAL_TABLET | Freq: Four times a day (QID) | ORAL | Status: DC | PRN
  Administered 2023-10-08: 500 mg via ORAL
  Filled 2023-10-08: qty 1

## 2023-10-08 MED ORDER — PANTOPRAZOLE SODIUM 40 MG IV SOLR
40.0000 mg | Freq: Every day | INTRAVENOUS | Status: DC
  Administered 2023-10-08: 40 mg via INTRAVENOUS
  Filled 2023-10-08: qty 10

## 2023-10-08 MED ORDER — METOPROLOL TARTRATE 5 MG/5ML IV SOLN: 5.0000 mg | Freq: Four times a day (QID) | INTRAVENOUS | Status: DC | PRN

## 2023-10-08 MED ORDER — FENTANYL CITRATE (PF) 100 MCG/2ML IJ SOLN
INTRAMUSCULAR | Status: AC
  Filled 2023-10-08: qty 2

## 2023-10-08 MED ORDER — MIDAZOLAM HCL 2 MG/2ML IJ SOLN
INTRAMUSCULAR | Status: AC
  Filled 2023-10-08: qty 2

## 2023-10-08 SURGICAL SUPPLY — 60 items
ANTIFOG SOL W/FOAM PAD STRL (MISCELLANEOUS) ×2 IMPLANT
BAG COUNTER SPONGE SURGICOUNT (BAG) ×2 IMPLANT
BLADE SURG SZ11 CARB STEEL (BLADE) ×2 IMPLANT
CHLORAPREP W/TINT 26 (MISCELLANEOUS) ×2 IMPLANT
COVER SURGICAL LIGHT HANDLE (MISCELLANEOUS) ×2 IMPLANT
COVER TIP SHEARS 8 DVNC (MISCELLANEOUS) IMPLANT
DEFOGGER SCOPE WARMER CLEARIFY (MISCELLANEOUS) IMPLANT
DERMABOND ADVANCED .7 DNX12 (GAUZE/BANDAGES/DRESSINGS) ×2 IMPLANT
DRAIN PENROSE 0.5X18 (DRAIN) IMPLANT
DRAPE ARM DVNC X/XI (DISPOSABLE) ×8 IMPLANT
DRAPE COLUMN DVNC XI (DISPOSABLE) ×2 IMPLANT
DRIVER NDL LRG 8 DVNC XI (INSTRUMENTS) ×4 IMPLANT
DRIVER NDL MEGA SUTCUT DVNCXI (INSTRUMENTS) ×2 IMPLANT
DRIVER NDLE LRG 8 DVNC XI (INSTRUMENTS) ×4 IMPLANT
DRIVER NDLE MEGA SUTCUT DVNCXI (INSTRUMENTS) ×2 IMPLANT
ELECT REM PT RETURN 15FT ADLT (MISCELLANEOUS) ×2 IMPLANT
ENDOLOOP SUT PDS II 0 18 (SUTURE) IMPLANT
GAUZE 4X4 16PLY ~~LOC~~+RFID DBL (SPONGE) ×2 IMPLANT
GLOVE BIO SURGEON STRL SZ 6 (GLOVE) ×6 IMPLANT
GLOVE INDICATOR 6.5 STRL GRN (GLOVE) ×6 IMPLANT
GOWN STRL REUS W/ TWL LRG LVL3 (GOWN DISPOSABLE) ×4 IMPLANT
GOWN STRL REUS W/ TWL XL LVL3 (GOWN DISPOSABLE) IMPLANT
GRASPER TIP-UP FEN DVNC XI (INSTRUMENTS) ×2 IMPLANT
IRRIG SUCT STRYKERFLOW 2 WTIP (MISCELLANEOUS) ×2 IMPLANT
IRRIGATION SUCT STRKRFLW 2 WTP (MISCELLANEOUS) ×2 IMPLANT
KIT BASIN OR (CUSTOM PROCEDURE TRAY) ×2 IMPLANT
KIT TURNOVER KIT A (KITS) IMPLANT
LUBRICANT JELLY K Y 4OZ (MISCELLANEOUS) IMPLANT
MARKER SKIN DUAL TIP RULER LAB (MISCELLANEOUS) ×2 IMPLANT
MESH BIO-A 7X10 SYN MAT (Mesh General) IMPLANT
NDL HYPO 22X1.5 SAFETY MO (MISCELLANEOUS) ×2 IMPLANT
NDL INSUFFLATION 14GA 120MM (NEEDLE) ×2 IMPLANT
NEEDLE HYPO 22X1.5 SAFETY MO (MISCELLANEOUS) ×2 IMPLANT
NEEDLE INSUFFLATION 14GA 120MM (NEEDLE) ×2 IMPLANT
PACK CARDIOVASCULAR III (CUSTOM PROCEDURE TRAY) ×2 IMPLANT
PAD POSITIONING PINK XL (MISCELLANEOUS) ×2 IMPLANT
RETRACTOR GRSP SML 8 DVNC XI (INSTRUMENTS) ×2 IMPLANT
SCISSORS LAP 5X35 DISP (ENDOMECHANICALS) IMPLANT
SCISSORS MNPLR CVD DVNC XI (INSTRUMENTS) ×2 IMPLANT
SEAL UNIV 5-12 XI (MISCELLANEOUS) ×6 IMPLANT
SEALER VESSEL EXT DVNC XI (MISCELLANEOUS) ×2 IMPLANT
SOL ELECTROSURG ANTI STICK (MISCELLANEOUS) ×2 IMPLANT
SOLUTION ANTFG W/FOAM PAD STRL (MISCELLANEOUS) ×2 IMPLANT
SOLUTION ELECTROSURG ANTI STCK (MISCELLANEOUS) ×2 IMPLANT
SPIKE FLUID TRANSFER (MISCELLANEOUS) ×2 IMPLANT
SUT ETHIBOND 0 36 GRN (SUTURE) ×4 IMPLANT
SUT MNCRL AB 4-0 PS2 18 (SUTURE) ×2 IMPLANT
SUT SILK 0 SH 30 (SUTURE) IMPLANT
SUT SILK 2 0 SH (SUTURE) IMPLANT
SYR 20ML ECCENTRIC (SYRINGE) IMPLANT
SYR 20ML LL LF (SYRINGE) ×2 IMPLANT
SYS RETRIEVAL 5MM INZII UNIV (BASKET) ×2 IMPLANT
SYSTEM RETRIEVL 5MM INZII UNIV (BASKET) IMPLANT
TIP INNERVISION DETACH 40FR (MISCELLANEOUS) IMPLANT
TIP INNERVISION DETACH 50FR (MISCELLANEOUS) IMPLANT
TIP INNERVISION DETACH 56FR (MISCELLANEOUS) IMPLANT
TOWEL OR 17X26 10 PK STRL BLUE (TOWEL DISPOSABLE) ×2 IMPLANT
TRAY FOLEY MTR SLVR 16FR STAT (SET/KITS/TRAYS/PACK) IMPLANT
TROCAR ADV FIXATION 5X100MM (TROCAR) ×2 IMPLANT
TUBING INSUFFLATION 10FT LAP (TUBING) ×2 IMPLANT

## 2023-10-08 NOTE — Discharge Instructions (Signed)
 POST OP INSTRUCTIONS   EAT **See Diet below**  WALK Walk an hour a day (cumulative- not all at once).  Control your pain to do that.    CONTROL PAIN Control pain so that you can walk, sleep, tolerate sneezing/coughing, go up/down stairs.  HAVE A BOWEL MOVEMENT DAILY Keep your bowels regular to avoid problems.  OK to try a laxative to override constipation.  OK to use an antidiarrheal to slow down diarrhea.  Call if not better after 2 tries  CALL IF YOU HAVE PROBLEMS/CONCERNS Call if you are still struggling despite following these instructions. Call if you have concerns not answered by these instructions  Take your usually prescribed home medications unless otherwise directed.  PAIN CONTROL: Pain is best controlled by a usual combination of three different methods TOGETHER: Ice/Heat Over the counter pain medication Prescription pain medication Most patients will experience some swelling and bruising around the incisions.  Ice packs or heating pads (30-60 minutes up to 6 times a day) will help. Use ice for the first few days to help decrease swelling and bruising, then switch to heat to help relax tight/sore spots and speed recovery.  Some people prefer to use ice alone, heat alone, alternating between ice & heat.  Experiment to what works for you.  Swelling and bruising can take several weeks to resolve.   It is helpful to take an over-the-counter pain medication regularly for the first few days: Naproxen (Aleve, etc)  Two 220mg  tabs twice a day OR Ibuprofen (Advil, etc) Three 200mg  tabs four times a day (every meal & bedtime) AND Acetaminophen (Tylenol, etc) 500-650mg  four times a day (every meal & bedtime) A  prescription for pain medication (such as oxycodone, hydrocodone, tramadol, gabapentin, methocarbamol, etc) should be given to you upon discharge.  Take your pain medication as prescribed, IF NEEDED.  If you are having problems/concerns with the prescription medicine (does not  control pain, nausea, vomiting, rash, itching, etc), please call us [phone] to see if we need to switch you to a different pain medicine that will work better for you and/or control your side effect better. If you need a refill on your pain medication, please give [place] 48 hour notice.  contact your pharmacy.  They will contact our office to request authorization. Prescriptions will not be filled after 5 pm or on week-ends  Avoid getting constipated.   Between the surgery and the pain medications, it is common to experience some constipation.   Increasing fluid intake and taking a fiber supplement (such as Metamucil, Citrucel, FiberCon, MiraLax, etc) 1-2 times a day regularly will usually help prevent this problem from occurring.   A mild laxative (prune juice, Milk of Magnesia, MiraLax, etc) should be taken according to package directions if there are no bowel movements after 48 hours.   Watch out for diarrhea.   If you have many loose bowel movements, simplify your diet to bland foods & liquids for a few days.   Stop any stool softeners and decrease your fiber supplement.   Switching to mild anti-diarrheal medications (Kayopectate, Pepto Bismol) can help.   If this worsens or does not improve, please call us.  Wash / shower every day.  You may shower over the skin glue which is waterproof.  Do not soak or submerge incisions.  No rubbing, scrubbing, lotions or ointments to incisions.  Glue will flake off after about 2 weeks.  You may leave the incisions open to air.  You may replace  a dressing/Band-Aid to cover the incision for comfort if you wish.   ACTIVITIES as tolerated:   You may resume regular (light) daily activities beginning the next day--such as daily self-care, walking, climbing stairs--gradually increasing activities as tolerated.  If you can walk 30 minutes without difficulty, it is safe to try more intense activity such as jogging, treadmill, bicycling, low-impact aerobics,  swimming, etc. Refrain from the most intensive and strenuous activity such as sit-ups, heavy lifting (>20lb), contact sports, etc. Avoid pushing, straining bearing down, retching/gagging, and forceful coughing as much as possible.   Refrain from any heavy lifting or straining until at least 6 weeks post-op.   DO NOT PUSH THROUGH PAIN.  Let pain be your guide: If it hurts to do something, don't do it.  Pain is your body warning you to avoid that activity for another week until the pain goes down. You may drive when you are no longer taking prescription pain medication, you can comfortably wear a seatbelt, and you can safely maneuver your car and apply brakes. You may have sexual intercourse when it is comfortable.  FOLLOW UP in our office Please call [facility] at [phone] to set up an appointment to see your surgeon in the office for a follow-up appointment approximately 2-3 weeks after your surgery. Make sure that you call for this appointment the day you arrive home to insure a convenient appointment time.  10. IF YOU HAVE DISABILITY OR FAMILY LEAVE FORMS, BRING THEM TO THE OFFICE FOR PROCESSING.  DO NOT GIVE THEM TO YOUR DOCTOR.   WHEN TO CALL us [phone]: Poor pain control Reactions / problems with new medications (rash/itching, nausea, etc)  Fever over 101.5 F (38.5 C) Inability to urinate Nausea and/or vomiting Worsening swelling or bruising Continued bleeding from incision. Increased pain, redness, or drainage from the incision   The clinic staff is available to answer your questions during regular business hours (8:30am-5pm).  Please don't hesitate to call and ask to speak to one of our nurses for clinical concerns.   If you have a medical emergency, go to the nearest emergency room or call 911.  A surgeon from [facility] is always on call at the [facility], [place] [address], [address], [place], [place]  [address] ? MAIN: [phone]  [phone] ? TOLL FREE: [phone] ?  FAX [phone] [email or web address]   EATING AFTER YOUR PARAESOPHAGEAL HERNIA REPAIR SURGERY  After your esophageal surgery, expect some sticking with swallowing over the next 1-2 months.    If food sticks when you eat, it is called "dysphagia".  This is due to swelling around your esophagus at the wrap & hiatal diaphragm repair.  It will gradually ease off over the next few months.  To help you through this temporary phase, we start you out on a pureed (blenderized) diet.  Your first meal in the hospital was thin liquids.  You should have been given a pureed diet by the time you left the hospital.  We ask patients to stay on a pureed diet for the first 2-3 weeks to avoid anything getting "stuck" near your recent surgery.  Don't be alarmed if your ability to swallow doesn't progress according to this plan.  Everyone is different and some diets can advance more or less quickly.    It is often helpful to crush your medications or split them as they can sometimes stick, especially the first week or so.   Some BASIC  RULES to follow are: Maintain an upright position whenever eating or drinking. Take small bites - just a teaspoon size bite at a time. Eat slowly.  It may also help to eat only one food at a time. Consider nibbling through smaller, more frequent meals & avoid the urge to eat BIG meals Do not push through feelings of fullness, nausea, or bloatedness Do not mix solid foods and liquids in the same mouthful Try not to "wash foods down" with large gulps of liquids. Avoid carbonated (bubbly/fizzy) drinks.   Avoid foods that make you feel gassy or bloated.  Start with bland foods first.  Wait on trying greasy, fried, or spicy meals until you are tolerating more bland solids well. Understand that it will be hard to burp and belch at first.  This gradually improves with time.  Expect to be more gassy/flatulent/bloated initially.  Walking  will help your body manage it better. Consider using medications for bloating that contain simethicone such as  Maalox or Gas-X  Consider crushing her medications, especially smaller pills.  The ability to swallow pills should get easier after a few weeks Eat in a relaxed atmosphere & minimize distractions. Avoid talking while eating.   Do not use straws. Following each meal, sit in an upright position (90 degree angle) for 60 to 90 minutes.  Going for a short walk can help as well If food does stick, don't panic.  Try to relax and let the food pass on its own.  Sipping WARM LIQUID such as strong hot black tea can also help slide it down.   Be gradual in changes & use common sense:  -If you easily tolerating a certain "level" of foods, advance to the next level gradually -If you are having trouble swallowing a particular food, then avoid it.   -If food is sticking when you advance your diet, go back to thinner previous diet (the lower LEVEL) for 1-2 days.  LEVEL 1 = PUREED DIET  Do for the first 2 WEEKS AFTER SURGERY  -Foods in this group are pureed or blenderized to a smooth, mashed potato-like consistency.  -If necessary, the pureed foods can keep their shape with the addition of a thickening agent.   -Meat should be pureed to a smooth, pasty consistency.  Hot broth or gravy may be added to the pureed meat, approximately 1 oz. of liquid per 3 oz. serving of meat. -CAUTION:  If any foods do not puree into a smooth consistency, swallowing will be more difficult.  (For example, nuts or seeds sometimes do not blend well.)  Hot Foods Cold Foods  Pureed scrambled eggs and cheese Pureed cottage cheese  Baby cereals Thickened juices and nectars  Thinned cooked cereals (no lumps) Thickened milk or eggnog  Pureed [place] toast or pancakes Ensure  Mashed potatoes Ice cream  Pureed parsley, au gratin, scalloped potatoes, candied sweet potatoes Fruit or [place] ice, sherbet  Pureed buttered or  alfredo noodles Plain yogurt  Pureed vegetables (no corn or peas) Instant breakfast  Pureed soups and creamed soups Smooth pudding, mousse, custard  Pureed scalloped apples Whipped gelatin  Gravies Sugar, syrup, honey, jelly  Sauces, cheese, tomato, barbecue, white, creamed Cream  Any baby food Creamer  Alcohol in moderation (not beer or champagne) Margarine  Coffee or tea Mayonnaise   Ketchup, mustard   Apple sauce   SAMPLE MENU:  PUREED DIET Breakfast Lunch Dinner  Orange juice, 1/2 cup Cream of wheat, 1/2 cup Pineapple juice, 1/2 cup Pureed  [place], barley soup, 3/4 cup Pureed Hawaiian chicken, 3 oz  Scrambled eggs, mashed or blended with cheese, 1/2 cup Tea or coffee, 1 cup  Whole milk, 1 cup  Non-dairy creamer, 2 Tbsp. Mashed potatoes, 1/2 cup Pureed cooled broccoli, 1/2 cup Apple sauce, 1/2 cup Coffee or tea Mashed potatoes, 1/2 cup Pureed spinach, 1/2 cup Frozen yogurt, 1/2 cup Tea or coffee      LEVEL 2 = SOFT DIET  After your first 2 weeks, you can advance to a soft diet.   Keep on this diet until everything goes down easily.  Hot Foods Cold Foods  White fish Cottage cheese  Stuffed fish Junior baby fruit  Baby food meals Semi thickened juices  Minced soft cooked, scrambled, poached eggs nectars  Souffle & omelets Ripe mashed bananas  Cooked cereals Canned fruit, pineapple sauce, milk  potatoes Milkshake  Buttered or Alfredo noodles Custard  Cooked cooled vegetable Puddings, including tapioca  Sherbet Yogurt  Vegetable soup or alphabet soup Fruit ice, [place] ice  Gravies Whipped gelatin  Sugar, syrup, honey, jelly Junior baby desserts  Sauces:  Cheese, creamed, barbecue, tomato, white Cream  Coffee or tea Margarine   SAMPLE MENU:  LEVEL 2 Breakfast Lunch Dinner  Orange juice, 1/2 cup Oatmeal, 1/2 cup Scrambled eggs with cheese, 1/2 cup Decaffeinated tea, 1 cup Whole milk, 1 cup Non-dairy creamer, 2 Tbsp Pineapple juice, 1/2 cup Minced beef, 3  oz Gravy, 2 Tbsp Mashed potatoes, 1/2 cup Minced fresh broccoli, 1/2 cup Applesauce, 1/2 cup Coffee, 1 cup [place], barley soup, 3/4 cup Minced Hawaiian chicken, 3 oz Mashed potatoes, 1/2 cup Cooked spinach, 1/2 cup Frozen yogurt, 1/2 cup Non-dairy creamer, 2 Tbsp      LEVEL 3 = CHOPPED DIET  -After all the foods in level 2 (soft diet) are passing through well you should advance up to more chopped foods.  -It is still important to cut these foods into small pieces and eat slowly.  Hot Foods Cold Foods  Poultry Cottage cheese  Chopped Swedish meatballs Yogurt  Meat salads (ground or flaked meat) Milk  Flaked fish (tuna) Milkshakes  Poached or scrambled eggs Soft, cold, dry cereal  Souffles and omelets Fruit juices or nectars  Cooked cereals Chopped canned fruit  Chopped [place] toast or pancakes Canned fruit cocktail  Noodles or pasta (no rice) Pudding, mousse, custard  Cooked vegetables (no frozen peas, corn, or mixed vegetables) Green salad  Canned small sweet peas Ice cream  Creamed soup or vegetable soup Fruit ice, [place] ice  Pureed vegetable soup or alphabet soup Non-dairy creamer  Ground scalloped apples Margarine  Gravies Mayonnaise  Sauces:  Cheese, creamed, barbecue, tomato, white Ketchup  Coffee or tea Mustard   SAMPLE MENU:  LEVEL 3 Breakfast Lunch Dinner  Orange juice, 1/2 cup Oatmeal, 1/2 cup Scrambled eggs with cheese, 1/2 cup Decaffeinated tea, 1 cup Whole milk, 1 cup Non-dairy creamer, 2 Tbsp Ketchup, 1 Tbsp Margarine, 1 tsp Salt, 1/4 tsp Sugar, 2 tsp Pineapple juice, 1/2 cup Ground beef, 3 oz Gravy, 2 Tbsp Mashed potatoes, 1/2 cup Cooked spinach, 1/2 cup Applesauce, 1/2 cup Decaffeinated coffee Whole milk Non-dairy creamer, 2 Tbsp Margarine, 1 tsp Salt, 1/4 tsp Pureed [place], barley soup, 3/4 cup Barbecue chicken, 3 oz Mashed potatoes, 1/2 cup Ground fresh broccoli, 1/2 cup Frozen yogurt, 1/2 cup Decaffeinated tea, 1 cup Non-dairy  creamer, 2 Tbsp Margarine, 1 tsp Salt, 1/4 tsp Sugar, 1 tsp    LEVEL 4:  REGULAR FOODS  -Foods  in this group are soft, moist, regularly textured foods.   -This level includes meat and breads, which tend to be the hardest things to swallow.   -Eat very slowly, chew well and continue to avoid carbonated drinks. -most people are at this level in 4-6 weeks  Hot Foods Cold Foods  Baked fish or skinned Soft cheeses - cottage cheese  Souffles and omelets Cream cheese  Eggs Yogurt  Stuffed shells Milk  Spaghetti with meat sauce Milkshakes  Cooked cereal Cold dry cereals (no nuts, dried fruit, coconut)  [place] toast or pancakes Crackers  Buttered toast Fruit juices or nectars  Noodles or pasta (no rice) Canned fruit  Potatoes (all types) Ripe bananas  Soft, cooked vegetables (no corn, lima, or baked beans) Peeled, ripe, fresh fruit  Creamed soups or vegetable soup Cakes (no nuts, dried fruit, coconut)  Canned chicken noodle soup Plain doughnuts  Gravies Ice cream  Bacon dressing Pudding, mousse, custard  Sauces:  Cheese, creamed, barbecue, tomato, white Fruit ice, [place] ice, sherbet  Decaffeinated tea or coffee Whipped gelatin  Pork chops Regular gelatin   Canned fruited gelatin molds   Sugar, syrup, honey, jam, jelly   Cream   Non-dairy   Margarine   Oil   Mayonnaise   Ketchup   Mustard   TROUBLESHOOTING IRREGULAR BOWELS  1) Avoid extremes of bowel movements (no bad constipation/diarrhea)  2) Miralax 17gm mixed in 8oz. water or juice-daily. May use BID as needed.  3) Gas-x,Phazyme, etc. as needed for gas & bloating.  4) Soft,bland diet. No spicy,greasy,fried foods.  5) Prilosec over-the-counter as needed  6) May hold gluten/wheat products from diet to see if symptoms improve.  7) May try probiotics (Align, Activa, etc) to help calm the bowels down  7) If symptoms become worse call back immediately.    If you have any questions please call our office at [facility]: [phone].

## 2023-10-08 NOTE — Transfer of Care (Signed)
 Immediate Anesthesia Transfer of Care Note  Patient: Diane Bryant  Procedure(s) Performed: ROBOTIC ASSISTED LAPAROSCOPIC REPAIR OF PARAESOPHAGEAL HERNIA GASTRECTOMY, LAPAROSCOPIC ENDOSCOPY, UPPER GI TRACT (Abdomen)  Patient Location: PACU  Anesthesia Type:General  Level of Consciousness: oriented, drowsy, and patient cooperative  Airway & Oxygen Therapy: Patient Spontanous Breathing and Patient connected to face mask oxygen  Post-op Assessment: Report given to RN and Post -op Vital signs reviewed and stable  Post vital signs: Reviewed and stable  Last Vitals:  Vitals Value Taken Time  BP 135/77 10/08/23 1309  Temp    Pulse 83 10/08/23 1313  Resp 17 10/08/23 1313  SpO2 100 % 10/08/23 1313  Vitals shown include unfiled device data.  Last Pain:  Vitals:   10/08/23 0851  TempSrc:   PainSc: 0-No pain         Complications: No notable events documented.

## 2023-10-08 NOTE — Interval H&P Note (Signed)
 History and Physical Interval Note:  10/08/2023 8:45 AM  Diane Bryant  has presented today for surgery, with the diagnosis of PARAESOPHAGEAL HERNIA WITH REFLUX.  The various methods of treatment have been discussed with the patient and family. After consideration of risks, benefits and other options for treatment, the patient has consented to  Procedure(s) with comments: ROBOTIC ASSISTED LAPAROSCOPIC REPAIR OF PARAESOPHAGEAL HERNIA (N/A) GASTRECTOMY, LAPAROSCOPIC (N/A) - POSSIBLE GASTROPEXY as a surgical intervention.  The patient's history has been reviewed, patient examined, no change in status, stable for surgery.  I have reviewed the patient's chart and labs.  Questions were answered to the patient's satisfaction.     Saroya Riccobono Lollie Sails

## 2023-10-08 NOTE — Care Management CC44 (Signed)
 Condition Code 44 Documentation Completed  Patient Details  Name: Diane Bryant MRN: [id number] Date of Birth: 02-Apr-1956   Condition Code 44 given:  Yes Patient signature on Condition Code 44 notice:  Yes Documentation of 2 MD's agreement:  Yes Code 44 added to claim:  Yes    Lavenia Atlas, RN 10/08/2023, 7:02 PM

## 2023-10-08 NOTE — Anesthesia Preprocedure Evaluation (Addendum)
 Anesthesia Evaluation  Patient identified by MRN, date of birth, ID band Patient awake    Reviewed: Allergy & Precautions, NPO status , Patient's Chart, lab work & pertinent test results, reviewed documented beta blocker date and time   History of Anesthesia Complications (+) Family history of anesthesia reactionNegative for: history of anesthetic complications  Airway Mallampati: II  TM Distance: >3 FB     Dental no notable dental hx.    Pulmonary neg shortness of breath, asthma , pneumonia, neg COPD, former smoker, neg PE   breath sounds clear to auscultation       Cardiovascular Exercise Tolerance: Poor (-) hypertension(-) angina (-) CAD and (-) Past MI (-) pacemaker(-) Cardiac Defibrillator + Valvular Problems/Murmurs  Rhythm:Regular Rate:Normal     Neuro/Psych neg Seizures PSYCHIATRIC DISORDERS Anxiety Depression     Neuromuscular disease    GI/Hepatic hiatal hernia, PUD,GERD  Medicated,,(+) neg Cirrhosis        Endo/Other    Renal/GU Renal disease     Musculoskeletal  (+) Arthritis , Osteoarthritis,  Spinal cord stimulator - currently turned off   Abdominal   Peds  Hematology   Anesthesia Other Findings   Reproductive/Obstetrics                             Anesthesia Physical Anesthesia Plan  ASA: 3  Anesthesia Plan: General   Post-op Pain Management:    Induction: Intravenous  PONV Risk Score and Plan: 3 and Ondansetron, Dexamethasone and Propofol infusion  Airway Management Planned: Oral ETT  Additional Equipment:   Intra-op Plan:   Post-operative Plan: Extubation in OR  Informed Consent: I have reviewed the patients History and Physical, chart, labs and discussed the procedure including the risks, benefits and alternatives for the proposed anesthesia with the patient or authorized representative who has indicated his/her understanding and acceptance.     Dental  advisory given  Plan Discussed with: CRNA  Anesthesia Plan Comments:        Anesthesia Quick Evaluation

## 2023-10-08 NOTE — Anesthesia Procedure Notes (Signed)
 Procedure Name: Intubation Date/Time: 10/08/2023 10:00 AM  Performed by: Garth Bigness, CRNAPre-anesthesia Checklist: Patient identified, Emergency Drugs available, Suction available and Patient being monitored Patient Re-evaluated:Patient Re-evaluated prior to induction Oxygen Delivery Method: Circle system utilized Preoxygenation: Pre-oxygenation with 100% oxygen Induction Type: IV induction Ventilation: Mask ventilation without difficulty Laryngoscope Size: Mac and 3 Grade View: Grade I Tube type: Oral Tube size: 7.0 mm Number of attempts: 1 Airway Equipment and Method: Stylet Placement Confirmation: ETT inserted through vocal cords under direct vision, positive ETCO2 and breath sounds checked- equal and bilateral Secured at: 22 cm Tube secured with: Tape Dental Injury: Teeth and Oropharynx as per pre-operative assessment

## 2023-10-08 NOTE — Care Management Obs Status (Signed)
 MEDICARE OBSERVATION STATUS NOTIFICATION   Patient Details  Name: KALAH PFLUM MRN: [id number] Date of Birth: [birth year]   Medicare Observation Status Notification Given:  Yes    Lavenia Atlas, RN 10/08/2023, 7:02 PM

## 2023-10-08 NOTE — Op Note (Signed)
 Operative Note  ANNSLEE TERCERO  [id number]  [id number]  10/08/2023   Surgeon: Phylliss Blakes MD FACS   Assistant: Gaynelle Adu MD FACS   Procedure performed: Robotic paraesophageal hernia repair, toupet Fundoplication, upper endoscopy   Preop diagnosis: paraesophageal hernia, GERD Post-op diagnosis/intraop findings: same; type III paraesophageal hernia   Specimens: none Retained items: none  EBL: 30cc Complications: none   Description of procedure: After confirming informed consent the patient was taken to the operating room and placed supine on operating room table where general endotracheal anesthesia was initiated, preoperative antibiotics were administered, SCDs applied, and a formal timeout was performed. The abdomen was prepped and draped in the usual sterile fashion. The peritoneal cavity was entered with a left subcostal veress needle and insufflated to . An 8mm periumbilical trocar and camera were then introduced and the abdominal cavity was inspected; there was no injury from entry. Bilateral TAPS blocks were performed under laparoscopic visualization using exparel mixed with 0.25% marcaine with epinephrine. Under direct visualization, three additional 8mm trocars and one 5mm trocar were placed. A subxiphoid incision was made and the liver retractor introduced for fixed retraction of the left lobe. The patient was then placed in steep reverse Trendelenburg, the robot was docked and all instruments inserted under direct visualization. The pars lucida was divided and the right crus identified.  With gentle retraction, the interface between the hernia sac and the right crus was able to be identified and a combination of vessel sealer and blunt dissection ensued to carefully free the hernia sac from the right crus beginning posteriorly and progressing anteriorly, across the anterior midline and onto the left crus.  At this point the vessel sealer was used to divide the short gastric  vessels and progressed cephalad to the posterior aspect of the left crus.  This dissection was connected with the anterior dissection from the left side until the hernia sac was completely freed from the crura.  We were then able to pass a Penrose posteriorly around the esophagus for additional retraction.  Mediastinal attachments of the esophagus were divided with blunt dissection and vessel sealer.  The vagus nerves were identified and protected throughout this dissection.  Along the left side, the adhesions between the esophagus and mediastinal tissues were somewhat dense but ultimately these were able to be cleared. There was no violation of the pleura on either side.  We were able to achieve about 4 cm of intra-abdominal esophageal length.  We then debrided the hernia sac which was excised, removed and discarded.  At this point upper endoscopy was performed confirming our anatomy and Z-line location.  Additionally the esophagus we was confirmed to be free of any injury.  At this point the crura were reapproximated posteriorly with simple interrupted 0 Ethibonds. A total of 4 sutures were used. This narrowed the hiatus nicely to about 2.5cm in diameter.  A piece of Gore Bio-A mesh was trimmed slightly and placed along the crural repair.  This was secured to the posterior crural repair as well as bilaterally to the right and left crus with 3 simple interrupted 0 Ethibonds. The fundus of the stomach was grasped from the right side and pulled to wrap around the distal esophagus.  Shoeshine maneuver was performed confirming appropriate orientation of the wrap.  A 270 degree posterior wrap (toupet) was performed with 3 simple interrupted 0 Ethibonds on each side.  On completion there is no tension, compression or twisting of the esophagus from the wrap.  A single  gastropexy suture was placed between the left side of the wrap and the left crus.  The abdomen was then inspected for hemostasis which was confirmed.  No  evidence of other injuries.  The distal esophagus and wrap rested without tension in the abdominal cavity and there again is no twisting or significant angulation of the esophagus.  At this point I repeated the upper endoscopy and noted that the GE junction was patent, traversable with the endoscope and on retroflexion the wrap appears intact again with no luminal evidence of any injury. The liver retractor was removed under direct visualization. The abdomen was then desufflated and all trochars removed. Our skin incisions were closed with subcuticular 4-0 Monocryl and dermabond. The patient was then awakened, extubated and taken to PACU in stable condition.    All counts were correct at the completion of the case.

## 2023-10-09 ENCOUNTER — Ambulatory Visit: Payer: Medicare Other | Admitting: Gastroenterology

## 2023-10-09 ENCOUNTER — Encounter (HOSPITAL_COMMUNITY): Payer: Self-pay | Admitting: Surgery

## 2023-10-09 DIAGNOSIS — K449 Diaphragmatic hernia without obstruction or gangrene: Secondary | ICD-10-CM | POA: Diagnosis not present

## 2023-10-09 LAB — CBC
HCT: 38.4 % (ref 36.0–46.0)
Hemoglobin: 12.8 g/dL (ref 12.0–15.0)
MCH: 34.4 pg — ABNORMAL HIGH (ref 26.0–34.0)
MCHC: 33.3 g/dL (ref 30.0–36.0)
MCV: 103.2 fL — ABNORMAL HIGH (ref 80.0–100.0)
Platelets: 193 10*3/uL (ref 150–400)
RBC: 3.72 MIL/uL — ABNORMAL LOW (ref 3.87–5.11)
RDW: 12.3 % (ref 11.5–15.5)
WBC: 15.5 10*3/uL — ABNORMAL HIGH (ref 4.0–10.5)
nRBC: 0 % (ref 0.0–0.2)

## 2023-10-09 LAB — BASIC METABOLIC PANEL
Anion gap: 4 — ABNORMAL LOW (ref 5–15)
BUN: 8 mg/dL (ref 8–23)
CO2: 23 mmol/L (ref 22–32)
Calcium: 8.4 mg/dL — ABNORMAL LOW (ref 8.9–10.3)
Chloride: 110 mmol/L (ref 98–111)
Creatinine, Ser: 0.66 mg/dL (ref 0.44–1.00)
GFR, Estimated: >60 mL/min (ref >60)
Glucose, Bld: 99 mg/dL (ref 70–99)
Potassium: 3.5 mmol/L (ref 3.5–5.1)
Sodium: 137 mmol/L (ref 135–145)

## 2023-10-09 LAB — MAGNESIUM: Magnesium: 1.9 mg/dL (ref 1.7–2.4)

## 2023-10-09 MED ORDER — DOCUSATE SODIUM 100 MG PO CAPS
100.0000 mg | ORAL_CAPSULE | Freq: Two times a day (BID) | ORAL | 0 refills | Status: AC
Start: 2023-10-09 — End: 2023-11-08

## 2023-10-09 MED ORDER — TRAMADOL HCL 50 MG PO TABS: 50.0000 mg | ORAL_TABLET | Freq: Four times a day (QID) | ORAL | 0 refills | Status: AC | PRN

## 2023-10-09 MED ORDER — ONDANSETRON 4 MG PO TBDP
4.0000 mg | ORAL_TABLET | Freq: Three times a day (TID) | ORAL | 0 refills | Status: AC | PRN
Start: 2023-10-09 — End: ?

## 2023-10-09 NOTE — Progress Notes (Signed)
 Discharge instructions given to patient and all questions were answered.

## 2023-10-09 NOTE — Anesthesia Postprocedure Evaluation (Signed)
 Anesthesia Post Note  Patient: Diane Bryant  Procedure(s) Performed: ROBOTIC ASSISTED LAPAROSCOPIC REPAIR OF PARAESOPHAGEAL HERNIA GASTRECTOMY, LAPAROSCOPIC ENDOSCOPY, UPPER GI TRACT (Abdomen)     Patient location during evaluation: PACU Anesthesia Type: General Level of consciousness: awake and alert Pain management: pain level controlled Vital Signs Assessment: post-procedure vital signs reviewed and stable Respiratory status: spontaneous breathing, nonlabored ventilation, respiratory function stable and patient connected to nasal cannula oxygen Cardiovascular status: blood pressure returned to baseline and stable Postop Assessment: no apparent nausea or vomiting Anesthetic complications: no   No notable events documented.    Pain Goal: Patients Stated Pain Goal: 2 (10/08/23 1933)                 Mariann Barter

## 2023-10-09 NOTE — Plan of Care (Signed)
   Problem: Education: Goal: Knowledge of General Education information will improve Description Including pain rating scale, medication(s)/side effects and non-pharmacologic comfort measures Outcome: Progressing   Problem: Health Behavior/Discharge Planning: Goal: Ability to manage health-related needs will improve Outcome: Progressing

## 2023-10-09 NOTE — Plan of Care (Signed)
   Problem: Education: Goal: Knowledge of General Education information will improve Description: Including pain rating scale, medication(s)/side effects and non-pharmacologic comfort measures Outcome: Progressing   Problem: Nutrition: Goal: Adequate nutrition will be maintained Outcome: Progressing

## 2023-10-09 NOTE — Discharge Summary (Signed)
 Physician Discharge Summary  Patient ID: Diane Bryant MRN: [id number] DOB/AGE: 03/20/1956 68 y.o.  Admit date: 10/08/2023 Discharge date: 10/09/2023  Admission Diagnoses: paraesophageal hernia  Discharge Diagnoses:  Principal Problem:   S/P repair of paraesophageal hernia   Allergies as of 10/09/2023       Reactions   Carbidopa-levodopa Shortness Of Breath   Felt Winded , sluggish,    Cortisone    Cortisone eye drops nausea and headache   Gabapentin Anxiety   Tight chest,no appetite,shakey   Advil [ibuprofen] Other (See Comments)   ulcer   Amantadines    Flu-Like Symptoms   Bee Venom    Covid-19 (mrna) Vaccine    Abdominal Pain   Other Diarrhea, Nausea And Vomiting   [name]   Prednisone    Pt sts that she had left facial numbness    Penicillins Swelling   Arm swelling at injection site as a child   Percocet [oxycodone-acetaminophen] Other (See Comments)   Felt like face was drooping        Medication List     TAKE these medications    ALPRAZolam 1 MG tablet Commonly known as: XANAX Take 1 mg by mouth daily as needed (Tremors).   CAL-MAG-ZINC PO Take 1 tablet by mouth daily.   clonazePAM 0.5 MG tablet Commonly known as: KLONOPIN Take 0.5 mg by mouth 2 (two) times daily as needed (parkinson's).   cyclobenzaprine 10 MG tablet Commonly known as: FLEXERIL Take 10 mg by mouth 3 (three) times daily as needed for muscle spasms.   diclofenac Sodium 1 % Gel Commonly known as: VOLTAREN Apply 2 g topically daily as needed (pain).   Digestive Enzyme Caps Take 1 capsule by mouth daily as needed (Digestive).   docusate sodium 100 MG capsule Commonly known as: Colace Take 1 capsule (100 mg total) by mouth 2 (two) times daily. Okay to decrease to once daily or stop taking if having loose bowel movements   famotidine 40 MG tablet Commonly known as: Pepcid Take 1 tablet (40 mg total) by mouth daily.   Fish Oil 1000 MG Caps Take 1,000 mg by mouth daily.    Kelp 0.15 MG Tabs Take 15 mg by mouth every other day.   Misc. Devices Misc Apply 1 application  topically daily. [facility]   ondansetron 4 MG disintegrating tablet Commonly known as: ZOFRAN-ODT Take 1 tablet (4 mg total) by mouth every 8 (eight) hours as needed.   PROBIOTIC PO Take 1 tablet by mouth daily. Pre-pro   progesterone 100 MG capsule Commonly known as: PROMETRIUM Take 100 mg by mouth daily.   tetrahydrozoline 0.05 % ophthalmic solution Place 1 drop into both eyes in the morning.   traMADol 50 MG tablet Commonly known as: Ultram Take 1 tablet (50 mg total) by mouth every 6 (six) hours as needed for up to 5 days (pain not relievd by tylenol, ibuprofen, other medications, ice, rest etc).   traZODone 50 MG tablet Commonly known as: DESYREL Take 50 mg by mouth at bedtime as needed for sleep.   Vitamin D3 50 MCG (2000 UT) capsule Take 2,000 Units by mouth daily.        Follow-up Information     Berna Bue, MD Follow up in 3 week(s).   Specialty: General Surgery Contact information: [address] [place] [address] [phone]                 Signed: Berna Bue 10/09/2023,  4:58 PM

## 2023-10-09 NOTE — Progress Notes (Signed)
 S: uneventful night. Had some nausea initially which is resolved, and some gas pain in her chest. Notes puffiness in her face. Tolerating water without dysphagia. No abdominal pain.  O: Vitals, labs, intake/output, and orders reviewed at this time. Afebrile, HR/BP wnl, sats 97% RA. PO 500; UOP 1750. BMP/Mg/CBC unremarkable  PRN meds- tramadol x 1, simethicone x 1, compazine x 1, zofran x 1, robaxin x1, dilaudid x 1, all before 8pm last night  Gen: A&Ox3, no distress. Mild crepitus to face H&N: EOMI, atraumatic, neck supple Chest: unlabored respirations, RRR Abd: soft, nontender, nondistended, incision(s) c/d/i with dermabond, no cellulitis or hematoma Ext: warm, no edema Neuro: grossly normal  Lines/tubes/drains: PIV  A/P: POD 1 s/p robotic paraesophageal hernia repair with mesh and toupet fundoplication -Ambulate, pulm toilet, SCDs while in bed, SQH -Advance to puree diet -Plan discharge home later today   Phylliss Blakes, MD [facility], [place]

## 2024-01-16 ENCOUNTER — Ambulatory Visit (HOSPITAL_COMMUNITY): Admit: 2024-01-16 | Payer: Medicare Other | Admitting: Gastroenterology

## 2024-01-16 ENCOUNTER — Encounter (HOSPITAL_COMMUNITY): Payer: Self-pay

## 2024-01-16 SURGERY — PH IMPEDANCE STUDY
Anesthesia: Choice
